# Patient Record
Sex: Female | Born: 1995 | Race: White | Hispanic: No | Marital: Single | Smoking: Former smoker
Health system: Southern US, Community
[De-identification: ages and names within clinical notes are randomized; demographics above are authoritative.]

## PROBLEM LIST (undated history)

## (undated) DIAGNOSIS — Z8041 Family history of malignant neoplasm of ovary: Secondary | ICD-10-CM

## (undated) DIAGNOSIS — F329 Major depressive disorder, single episode, unspecified: Secondary | ICD-10-CM

## (undated) DIAGNOSIS — T50902A Poisoning by unspecified drugs, medicaments and biological substances, intentional self-harm, initial encounter: Secondary | ICD-10-CM

## (undated) DIAGNOSIS — F1310 Sedative, hypnotic or anxiolytic abuse, uncomplicated: Secondary | ICD-10-CM

## (undated) DIAGNOSIS — F419 Anxiety disorder, unspecified: Secondary | ICD-10-CM

## (undated) DIAGNOSIS — F309 Manic episode, unspecified: Secondary | ICD-10-CM

## (undated) DIAGNOSIS — F319 Bipolar disorder, unspecified: Secondary | ICD-10-CM

## (undated) DIAGNOSIS — L709 Acne, unspecified: Secondary | ICD-10-CM

## (undated) DIAGNOSIS — F19922 Other psychoactive substance use, unspecified with intoxication with perceptual disturbance: Secondary | ICD-10-CM

## (undated) DIAGNOSIS — F3181 Bipolar II disorder: Secondary | ICD-10-CM

## (undated) DIAGNOSIS — K649 Unspecified hemorrhoids: Secondary | ICD-10-CM

## (undated) DIAGNOSIS — Z87828 Personal history of other (healed) physical injury and trauma: Secondary | ICD-10-CM

## (undated) DIAGNOSIS — F32A Depression, unspecified: Secondary | ICD-10-CM

## (undated) DIAGNOSIS — K219 Gastro-esophageal reflux disease without esophagitis: Secondary | ICD-10-CM

## (undated) HISTORY — DX: Major depressive disorder, single episode, unspecified: F32.9

## (undated) HISTORY — PX: TONSILLECTOMY: SUR1361

## (undated) HISTORY — DX: Anxiety disorder, unspecified: F41.9

## (undated) HISTORY — DX: Depression, unspecified: F32.A

## (undated) HISTORY — DX: Bipolar disorder, unspecified: F31.9

## (undated) HISTORY — DX: Family history of malignant neoplasm of ovary: Z80.41

---
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
## 2015-10-04 ENCOUNTER — Encounter: Payer: Self-pay | Admitting: Psychiatry

## 2015-10-04 ENCOUNTER — Ambulatory Visit (INDEPENDENT_AMBULATORY_CARE_PROVIDER_SITE_OTHER): Payer: 59 | Admitting: Psychiatry

## 2015-10-04 VITALS — BP 118/78 | HR 99 | Temp 99.2°F | Ht 63.0 in | Wt 121.2 lb

## 2015-10-04 DIAGNOSIS — F3132 Bipolar disorder, current episode depressed, moderate: Secondary | ICD-10-CM

## 2015-10-04 MED ORDER — LURASIDONE HCL 40 MG PO TABS
40.0000 mg | ORAL_TABLET | Freq: Every day | ORAL | Status: DC
Start: 2015-10-04 — End: 2015-11-01
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
## 2015-10-30 ENCOUNTER — Other Ambulatory Visit
Admission: RE | Admit: 2015-10-30 | Discharge: 2015-10-30 | Disposition: A | Discharge status: Home / Self Care | Payer: 59 | Source: Ambulatory Visit | Attending: Psychiatry | Admitting: Psychiatry

## 2015-10-30 DIAGNOSIS — F319 Bipolar disorder, unspecified: Secondary | ICD-10-CM | POA: Diagnosis not present

## 2015-10-30 DIAGNOSIS — Z7984 Long term (current) use of oral hypoglycemic drugs: Secondary | ICD-10-CM | POA: Diagnosis present

## 2015-10-30 LAB — GLUCOSE, RANDOM: Glucose, Bld: 101 mg/dL — ABNORMAL HIGH (ref 65–99)

## 2015-10-30 LAB — LIPID PANEL
CHOLESTEROL: 230 mg/dL — AB (ref 0–200)
HDL: 62 mg/dL (ref >40)
LDL Cholesterol: 153 mg/dL — ABNORMAL HIGH (ref 0–99)
Total CHOL/HDL Ratio: 3.7 RATIO
Triglycerides: 76 mg/dL (ref <150)
VLDL: 15 mg/dL (ref 0–40)

## 2015-10-31 ENCOUNTER — Telehealth: Payer: Self-pay | Admitting: Psychiatry

## 2015-11-01 ENCOUNTER — Encounter: Payer: Self-pay | Admitting: Psychiatry

## 2015-11-01 ENCOUNTER — Ambulatory Visit (INDEPENDENT_AMBULATORY_CARE_PROVIDER_SITE_OTHER): Payer: 59 | Admitting: Psychiatry

## 2015-11-01 VITALS — BP 120/78 | HR 90 | Temp 97.9°F | Ht 63.0 in | Wt 124.4 lb

## 2015-11-01 DIAGNOSIS — F3132 Bipolar disorder, current episode depressed, moderate: Secondary | ICD-10-CM

## 2015-11-01 MED ORDER — CLONAZEPAM 0.5 MG PO TABS: 0.5000 mg | ORAL_TABLET | Freq: Two times a day (BID) | ORAL | Status: DC | PRN

## 2015-11-01 MED ORDER — LURASIDONE HCL 40 MG PO TABS: 40.0000 mg | ORAL_TABLET | Freq: Every day | ORAL | Status: DC

## 2015-11-01 NOTE — Progress Notes (Addendum)
BH MD/PA/NP OP Progress Note  11/01/2015 3:12 PM Holly Farrell  MRN:  [id number]  Subjective:  Patient returns for follow-up of her bipolar disorder most recent episode depressed. It appears there was some confusion in regards to her medications. We have started her Latuda 40 mg a day. Patient reports she liked the medication. She states she felt less depressed and she felt like her mood was more "even." However we provide her with about 2 weeks of samples and I did send a prescription to her pharmacy. However I did note for them to hold the prescription until patient called them that she had 2 weeks' worth of samples. However patient states when she went to the pharmacy they told her she had no prescription there.  Patient states that the only issue she's had is that the JordanLatuda made her a little bit sleepy and so she's been taking at bedtime and she was fine with this. She states that she also notices that when she is anxious or do something stressful going on at work she has some shaking. For example she states if she holds a cigarette she does cigarette will be shaking. She states it does not happen every day and typically only happens during stress. I discussed with her the issue of akathisia post whether this is just simple anxiety which it appears to be from the fact that it's prompted by stressors. Chief Complaint: Some shaking Chief Complaint    Follow-up; Medication Refill     Visit Diagnosis:     ICD-9-CM ICD-10-CM   1. Bipolar affective disorder, currently depressed, moderate (HCC) 296.52 F31.32     Past Medical History:  Past Medical History  Diagnosis Date  . Anxiety   . Depression   . Bipolar disorder [facility](HCC)     Past Surgical History  Procedure Laterality Date  . Tonsillectomy     Family History:  Family History  Problem Relation Age of Onset  . Depression Mother   . Anxiety disorder Mother   . Bipolar disorder Mother   . Hypertension Mother   . Asthma Father   .  Alcohol abuse Father   . Sleep disorder Brother   . Bipolar disorder Maternal Grandfather   . Hypertension Maternal Grandmother    Social History:  Social History   Social History  . Marital Status: Single    Spouse Name: N/A  . Number of Children: N/A  . Years of Education: N/A   Social History Main Topics  . Smoking status: Current Every Day Smoker    Types: Cigarettes    Start date: 10/03/2014  . Smokeless tobacco: Never Used  . Alcohol Use: 0.0 - 7.2 oz/week    0 Glasses of wine, 0 Standard drinks or equivalent, 0-8 Cans of beer, 0-4 Shots of liquor per week  . Drug Use: Yes    Special: Marijuana, Cocaine     Comment: marjuana last used 09-29-15  cocaine last used aug  . Sexual Activity: Yes    Birth Control/ Protection: None, Pill   Other Topics Concern  . None   Social History Narrative   Additional History:   Assessment:   Musculoskeletal: Strength & Muscle Tone: within normal limits Gait & Station: normal Patient leans: N/A  Psychiatric Specialty Exam: HPI  Review of Systems  Psychiatric/Behavioral: Negative for depression, suicidal ideas, hallucinations, memory loss and substance abuse. The patient is nervous/anxious. The patient does not have insomnia.   All other systems reviewed and are negative.  Blood pressure 120/78, pulse 90, temperature 97.9 F (36.6 C), temperature source Tympanic, height  (1.6 m), weight 124 lb 6.4 oz (56.427 kg), last menstrual period 10/09/2015, SpO2 98 %.Body mass index is 22.04 kg/(m^2).  General Appearance: Well Groomed  Eye Contact:  Good  Speech:  Clear and Coherent  Volume:  Normal  Mood:  Good  Affect:  bright, happy  Thought Process:  Linear and Logical  Orientation:  Full (Time, Place, and Person)  Thought Content:  Negative  Suicidal Thoughts:  No  Homicidal Thoughts:  No  Memory:  Immediate;   Good Recent;   Good Remote;   Good  Judgement:  Good  Insight:  Good  Psychomotor Activity:  Negative   Concentration:  Good  Recall:  Good  Fund of Knowledge: Good  Language: Good  Akathisia:  Negative  Handed:    AIMS (if indicated):  Done 11/01/15 and normal  Assets:  Communication Skills Desire for Improvement Social Support Vocational/Educational  ADL's:  Intact  Cognition: WNL  Sleep:  good   Is the patient at risk to self?  No. Has the patient been a risk to self in the past 6 months?  No. Has the patient been a risk to self within the distant past?  Yes.   Is the patient a risk to others?  No. Has the patient been a risk to others in the past 6 months?  No. Has the patient been a risk to others within the distant past?  No.  Current Medications: Current Outpatient Prescriptions  Medication Sig Dispense Refill  . lurasidone (LATUDA) 40 MG TABS tablet Take 1 tablet (40 mg total) by mouth daily with breakfast. 30 tablet 1  . clonazePAM (KLONOPIN) 0.5 MG tablet Take 1 tablet (0.5 mg total) by mouth 2 (two) times daily as needed for anxiety. 60 tablet 1   No current facility-administered medications for this visit.    Medical Decision Making:  Established Problem, Stable/Improving (1), Review of Medication Regimen & Side Effects (2) and Review of New Medication or Change in Dosage (2)  Treatment Plan Summary:Medication management and Plan   Bipolar disorder, most recent episode depressed-patient reported benefit from Jordan. We'll restart her at Latuda 40 mg daily. She felt her mood was more stable and she was happy. She only used a two-week supply of the samples and due to the pharmacy issue noted above she has not been on it. She would like to continue this medication. She does describe some shaking which is episodic and she describes it occurs during stressors. We will start some clonazepam 0.5 mg twice daily as needed to address this issue which is sounds more like anxiety as opposed to an akathisia however we will touch base again after she assesses how she responds to the  clonazepam. Risk and benefits discussed and patient able to consent area   Wallace Going 11/01/2015, 3:12 PM

## 2015-11-15 NOTE — Telephone Encounter (Signed)
will discuss labwork at next appt  12-01-15

## 2015-12-01 ENCOUNTER — Ambulatory Visit: Payer: 59 | Admitting: Psychiatry

## 2015-12-06 ENCOUNTER — Encounter: Payer: Self-pay | Admitting: Psychiatry

## 2015-12-06 ENCOUNTER — Ambulatory Visit (INDEPENDENT_AMBULATORY_CARE_PROVIDER_SITE_OTHER): Payer: 59 | Admitting: Psychiatry

## 2015-12-06 VITALS — BP 122/78 | HR 87 | Temp 97.9°F | Ht 63.0 in | Wt 122.4 lb

## 2015-12-06 DIAGNOSIS — F3132 Bipolar disorder, current episode depressed, moderate: Secondary | ICD-10-CM

## 2015-12-06 MED ORDER — QUETIAPINE FUMARATE ER 150 MG PO TB24: 150.0000 mg | ORAL_TABLET | Freq: Every day | ORAL | Status: DC

## 2015-12-06 NOTE — Progress Notes (Signed)
BH MD/PA/NP OP Progress Note  12/06/2015 2:18 PM Holly Farrell  MRN:  [id number]  Subjective:  Patient returns for follow-up of her bipolar disorder most recent episode depressed. At the last visit the patient reports some benefit from JordanLatuda. However she presents to her appointment today with her mother. Mother states that perhaps Holly KnudsenLatuda was not helpful. They discussed excessive sedation which the patient was sleeping. Patient today states she really feels like the JordanLatuda made her not having any emotions flattened her and is in the past was making her sleepy.  Mother indicates she's been listening to a radio broadcast about bipolar and feels like the discussion of symptoms and issues make her more convinced that her daughter does have bipolar. Mother also states that there was a period of time since last visit where the patient has had irritability. Mother states that the patient is a lot taking the Klonopin and the mother has possession of them because during one of these irritable episodes patient threatened to take all of the Klonopin. Mother states the patient never did that  I discussed that there are other medications as options. However patient brought up that initially when she began treatment she was given some Seroquel. She states she was given Seroquel XL 400 mg this made her very sleepy but she reports that she did like the medication. Chief Complaint: Holly Farrell Latuda Chief Complaint    Follow-up; Medication Refill; Medication Problem; Drug Problem     Visit Diagnosis:     ICD-9-CM ICD-10-CM   1. Bipolar affective disorder, currently depressed, moderate (HCC) 296.52 F31.32     Past Medical History:  Past Medical History  Diagnosis Date  . Anxiety   . Depression   . Bipolar disorder [facility](HCC)     Past Surgical History  Procedure Laterality Date  . Tonsillectomy     Family History:  Family History  Problem Relation Age of Onset  . Depression Mother   . Anxiety disorder Mother    . Bipolar disorder Mother   . Hypertension Mother   . Asthma Father   . Alcohol abuse Father   . Sleep disorder Brother   . Bipolar disorder Maternal Grandfather   . Hypertension Maternal Grandmother    Social History:  Social History   Social History  . Marital Status: Single    Spouse Name: N/A  . Number of Children: N/A  . Years of Education: N/A   Social History Main Topics  . Smoking status: Current Every Day Smoker    Types: Cigarettes    Start date: 10/03/2014  . Smokeless tobacco: Never Used  . Alcohol Use: 0.0 - 7.2 oz/week    0 Glasses of wine, 0-8 Cans of beer, 0-4 Shots of liquor, 0 Standard drinks or equivalent per week  . Drug Use: Yes    Special: Marijuana, Cocaine     Comment: marjuana last used 11-18-15  cocaine last used 11-26 -16  . Sexual Activity: Yes    Birth Control/ Protection: None, Pill   Other Topics Concern  . None   Social History Narrative   Additional History:   Assessment:   Musculoskeletal: Strength & Muscle Tone: within normal limits Gait & Station: normal Patient leans: N/A  Psychiatric Specialty Exam: Drug Problem Pertinent negatives include no hallucinations.    Review of Systems  Psychiatric/Behavioral: Negative for depression, suicidal ideas, hallucinations, memory loss and substance abuse. The patient is not nervous/anxious and does not have insomnia.  Patient doing well today however per mother there were periods where patient has been irritable and had mood fluctuations.  All other systems reviewed and are negative.   Blood pressure 122/78, pulse 87, temperature 97.9 F (36.6 C), temperature source Tympanic, height  (1.6 m), weight 122 lb 6.4 oz (55.52 kg), SpO2 96 %.Body mass index is 21.69 kg/(m^2).  General Appearance: Well Groomed  Eye Contact:  Good  Speech:  Clear and Coherent  Volume:  Normal  Mood:  Good  Affect:  bright, happy  Thought Process:  Linear and Logical  Orientation:  Full (Time,  Place, and Person)  Thought Content:  Negative  Suicidal Thoughts:  No  Homicidal Thoughts:  No  Memory:  Immediate;   Good Recent;   Good Remote;   Good  Judgement:  Good  Insight:  Good  Psychomotor Activity:  Negative  Concentration:  Good  Recall:  Good  Fund of Knowledge: Good  Language: Good  Akathisia:  Negative  Handed:    AIMS (if indicated):  Done 11/01/15 and normal  Assets:  Communication Skills Desire for Improvement Social Support Vocational/Educational  ADL's:  Intact  Cognition: WNL  Sleep:  good   Is the patient at risk to self?  No. Has the patient been a risk to self in the past 6 months?  No. Has the patient been a risk to self within the distant past?  Yes.   Is the patient a risk to others?  No. Has the patient been a risk to others in the past 6 months?  No. Has the patient been a risk to others within the distant past?  No.  Current Medications: Current Outpatient Prescriptions  Medication Sig Dispense Refill  . QUEtiapine Fumarate (SEROQUEL XR) 150 MG 24 hr tablet Take 1 tablet (150 mg total) by mouth at bedtime. 30 tablet 1   No current facility-administered medications for this visit.    Medical Decision Making:  Established Problem, Stable/Improving (1), Review of Medication Regimen & Side Effects (2) and Review of New Medication or Change in Dosage (2)  Treatment Plan Summary:Medication management and Plan   Bipolar disorder, most recent episode depressed-patient is now off Latuda because patient and mother feel it cause sedation and did not stabilize the mood. Given patient has tolerated the Seroquel in the past it may be the 400 mg dose was too much. Thus we will start Seroquel X are 50 mg at bedtime for 3 days and then patient will go to 150 mg at bedtime. We will discontinue the clonazepam as we were using it for possible akathisia related to [place] is no longer being prescribed. Risk and benefits have been discussing patient's  able consent. Patient will follow up in 1 month. I discussed my departure in favor 2016 and indicated that another doctor will be available within the practice to continue their care after my departure.  Samples of Seroquel XR 50 and 150 mg were provided. Wallace Going 12/06/2015, 2:18 PM

## 2015-12-06 NOTE — Patient Instructions (Signed)
Start with Seroquel XR 50 mg (SAMPLES) at bedtime for three nights, then take 150 mg at bedtime.

## 2016-01-04 ENCOUNTER — Ambulatory Visit (INDEPENDENT_AMBULATORY_CARE_PROVIDER_SITE_OTHER): Payer: 59 | Admitting: Psychiatry

## 2016-01-04 ENCOUNTER — Encounter: Payer: Self-pay | Admitting: Psychiatry

## 2016-01-04 VITALS — BP 120/80 | HR 90 | Temp 98.1°F | Ht 63.0 in | Wt 123.2 lb

## 2016-01-04 DIAGNOSIS — F3132 Bipolar disorder, current episode depressed, moderate: Secondary | ICD-10-CM | POA: Diagnosis not present

## 2016-01-04 MED ORDER — QUETIAPINE FUMARATE ER 50 MG PO TB24: 100.0000 mg | ORAL_TABLET | Freq: Every day | ORAL | Status: DC

## 2016-01-04 NOTE — Progress Notes (Signed)
BH MD/PA/NP OP Progress Note  01/04/2016 2:08 PM Holly Farrell  MRN:  [id number]  Subjective:  Patient returns for follow-up of her bipolar disorder most recent episode depressed. At the last visit we discontinued Latuda and started the patient on Seroquel X are. She took 50 mg and then after 3 days increase to 150 mg at bedtime. Patient feels like her mood is even and level however she states that she is feeling somewhat sedated. Her mother comes into the appointment and states that she has been sleeping a lot. I reviewed the sleep hygiene and it appeared that the patient might be having some sedation from her Seroquel. However patient stated she is been forgetting to take her Seroquel at bedtime and then takes it in the morning. I did discuss that the XR form relation does disperse more in the first hours of consumption. Patient indicated that she felt this may be her problem. Mother emphasized that the patient should use her phone to set an alarm to take the medicine.  We have decided were going to decrease her dose from the 150 mg at bedtime to 100 mg at bedtime. Of note the patient indicated to CMA that she uses marijuana almost daily and occasionally has used cocaine. We'll need to address this with patient and her next appointment. Chief Complaint: even Chief Complaint    Follow-up; Medication Refill     Visit Diagnosis:   No diagnosis found.  Past Medical History:  Past Medical History  Diagnosis Date  . Anxiety   . Depression   . Bipolar disorder [facility])     Past Surgical History  Procedure Laterality Date  . Tonsillectomy     Family History:  Family History  Problem Relation Age of Onset  . Depression Mother   . Anxiety disorder Mother   . Bipolar disorder Mother   . Hypertension Mother   . Asthma Father   . Alcohol abuse Father   . Sleep disorder Brother   . Bipolar disorder Maternal Grandfather   . Hypertension Maternal Grandmother    Social History:  Social  History   Social History  . Marital Status: Single    Spouse Name: N/A  . Number of Children: N/A  . Years of Education: N/A   Social History Main Topics  . Smoking status: Current Every Day Smoker    Types: Cigarettes    Start date: 10/03/2014  . Smokeless tobacco: Never Used  . Alcohol Use: 0.0 - 7.2 oz/week    0 Glasses of wine, 0-8 Cans of beer, 0-4 Shots of liquor, 0 Standard drinks or equivalent per week  . Drug Use: Yes    Special: Marijuana, Cocaine     Comment: marjuana last used yesterday cocaine last used 12-24-15  . Sexual Activity: Yes    Birth Control/ Protection: None, Pill   Other Topics Concern  . None   Social History Narrative   Additional History:   Assessment:   Musculoskeletal: Strength & Muscle Tone: within normal limits Gait & Station: normal Patient leans: N/A  Psychiatric Specialty Exam: Drug Problem Pertinent negatives include no hallucinations.    Review of Systems  Psychiatric/Behavioral: Negative for depression, suicidal ideas, hallucinations, memory loss and substance abuse. The patient is not nervous/anxious and does not have insomnia.        Patient doing well today however per mother there were periods where patient has been irritable and had mood fluctuations.  All other systems reviewed and are negative.  Blood pressure 120/80, pulse 90, temperature 98.1 F (36.7 C), temperature source Tympanic, height 5\' 3"  (1.6 m), weight 123 lb 3.2 oz (55.883 kg), last menstrual period 12/11/2015, SpO2 99 %.Body mass index is 21.83 kg/(m^2).  General Appearance: Well Groomed  Eye Contact:  Good  Speech:  Clear and Coherent  Volume:  Normal  Mood:  Good  Affect:  bright, happy  Thought Process:  Linear and Logical  Orientation:  Full (Time, Place, and Person)  Thought Content:  Negative  Suicidal Thoughts:  No  Homicidal Thoughts:  No  Memory:  Immediate;   Good Recent;   Good Remote;   Good  Judgement:  Good  Insight:  Good   Psychomotor Activity:  Negative  Concentration:  Good  Recall:  Good  Fund of Knowledge: Good  Language: Good  Akathisia:  Negative  Handed:    AIMS (if indicated):  Done 11/01/15 and normal  Assets:  Communication Skills Desire for Improvement Social Support Vocational/Educational  ADL's:  Intact  Cognition: WNL  Sleep:  good   Is the patient at risk to self?  No. Has the patient been a risk to self in the past 6 months?  No. Has the patient been a risk to self within the distant past?  Yes.   Is the patient a risk to others?  No. Has the patient been a risk to others in the past 6 months?  No. Has the patient been a risk to others within the distant past?  No.  Current Medications: No current outpatient prescriptions on file.   No current facility-administered medications for this visit.    Medical Decision Making:  Established Problem, Stable/Improving (1), Review of Medication Regimen & Side Effects (2) and Review of New Medication or Change in Dosage (2)  Treatment Plan Summary:Medication management and Plan   Bipolar disorder, most recent episode depressed-Asian is reporting some benefit from the Seroquel XR, feels sedated in the daytime. It might be because patient is taking it in the morning as opposed to bedtime. Nonetheless within a decrease her dose to 100 mg at bedtime and reassess in 1 month.   Samples of Seroquel XR 50, #8 and prescription was sent to her pharmacy. She'll follow up with writer in one month prior to his departure. She is aware she'll be seeing another provider in the clinic after that. Holly Farrell 01/04/2016, 2:08 PM

## 2016-01-30 ENCOUNTER — Ambulatory Visit (INDEPENDENT_AMBULATORY_CARE_PROVIDER_SITE_OTHER): Payer: 59 | Admitting: Psychiatry

## 2016-01-30 ENCOUNTER — Encounter: Payer: Self-pay | Admitting: Psychiatry

## 2016-01-30 VITALS — BP 108/68 | HR 96 | Temp 97.4°F | Ht 63.0 in | Wt 124.8 lb

## 2016-01-30 DIAGNOSIS — F3132 Bipolar disorder, current episode depressed, moderate: Secondary | ICD-10-CM

## 2016-01-30 MED ORDER — HYDROXYZINE PAMOATE 25 MG PO CAPS: ORAL_CAPSULE | ORAL | Status: DC

## 2016-01-30 MED ORDER — QUETIAPINE FUMARATE ER 50 MG PO TB24: 100.0000 mg | ORAL_TABLET | Freq: Every day | ORAL | Status: DC

## 2016-01-30 NOTE — Progress Notes (Signed)
BH MD/PA/NP OP Progress Note  01/30/2016 1:39 PM Holly Farrell  MRN:  [id number]  Subjective:  Patient returns for follow-up of her bipolar disorder most recent episode depressed. At the last visit the patient and mother discussed extensive daytime sedation. However it was revealed the patient was forgetting to take the Seroquel at bedtime was taking it in the morning and thus sleeping during the day. We have now emphasize the patient needs to take it at bedtime in addition we reduced her dose from 150-100 mg. Patient states that this dose she sleeping well and not experiencing any sedation. She feels like the medication is stabilizing her mood.  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 She looks forward to doing these things. She states the stressor right now is that her grandfather has been ill and was in the hospital for what and states may have been sepsis.   I discussed with her that CMA had informing the patient was using marijuana and occasionally cocaine. I clarified this with the patient and she indicates she does use the marijuana about 3 times a week. She states that the cocaine is infrequently and she last used around the holidays. I spent some time discussing that ideally we did not want her to be using these with her medications and she should refrain from such use.  Chief Complaint: Good Chief Complaint    Follow-up; Medication Refill; Anxiety; Drug Problem     Visit Diagnosis:     ICD-9-CM ICD-10-CM   1. Bipolar affective disorder, currently depressed, moderate (HCC) 296.52 F31.32     Past Medical History:  Past Medical History  Diagnosis Date  . Anxiety   . Depression   . Bipolar disorder [facility])     Past Surgical History  Procedure Laterality Date  . Tonsillectomy     Family History:  Family History  Problem Relation Age of Onset  . Depression Mother   . Anxiety  disorder Mother   . Bipolar disorder Mother   . Hypertension Mother   . Asthma Father   . Alcohol abuse Father   . Sleep disorder Brother   . Bipolar disorder Maternal Grandfather   . Hypertension Maternal Grandmother    Social History:  Social History   Social History  . Marital Status: Single    Spouse Name: N/A  . Number of Children: N/A  . Years of Education: N/A   Social History Main Topics  . Smoking status: Current Every Day Smoker    Types: Cigarettes    Start date: 10/03/2014  . Smokeless tobacco: Never Used  . Alcohol Use: 0.0 - 7.2 oz/week    0 Glasses of wine, 0-8 Cans of beer, 0-4 Shots of liquor, 0 Standard drinks or equivalent per week  . Drug Use: Yes    Special: Marijuana, Cocaine     Comment: marjuana last used yesterday cocaine last used 12-24-15  . Sexual Activity: Yes    Birth Control/ Protection: None, Pill   Other Topics Concern  . None   Social History Narrative   Additional History:   Assessment:   Musculoskeletal: Strength & Muscle Tone: within normal limits Gait & Station: normal Patient leans: N/A  Psychiatric Specialty Exam: Anxiety Symptoms include nervous/anxious behavior. Patient reports no insomnia or suicidal ideas.    Drug Problem Pertinent negatives include no hallucinations.    Review of Systems  Psychiatric/Behavioral: Negative  for depression, suicidal ideas, hallucinations, memory loss and substance abuse. The patient is nervous/anxious. The patient does not have insomnia.        Patient doing well today however per mother there were periods where patient has been irritable and had mood fluctuations.  All other systems reviewed and are negative.   Blood pressure 108/68, pulse 96, temperature 97.4 F (36.3 C), temperature source Tympanic, height  (1.6 m), weight 124 lb 12.8 oz (56.609 kg), last menstrual period 01/09/2016, SpO2 98 %.Body mass index is 22.11 kg/(m^2).  General Appearance: Well Groomed  Eye Contact:   Good  Speech:  Clear and Coherent  Volume:  Normal  Mood:  Good  Affect:  bright, happy  Thought Process:  Linear and Logical  Orientation:  Full (Time, Place, and Person)  Thought Content:  Negative  Suicidal Thoughts:  No  Homicidal Thoughts:  No  Memory:  Immediate;   Good Recent;   Good Remote;   Good  Judgement:  Good  Insight:  Good  Psychomotor Activity:  Negative  Concentration:  Good  Recall:  Good  Fund of Knowledge: Good  Language: Good  Akathisia:  Negative  Handed:    AIMS (if indicated):  Done 11/01/15 and normal  Assets:  Communication Skills Desire for Improvement Social Support Vocational/Educational  ADL's:  Intact  Cognition: WNL  Sleep:  good   Is the patient at risk to self?  No. Has the patient been a risk to self in the past 6 months?  No. Has the patient been a risk to self within the distant past?  Yes.   Is the patient a risk to others?  No. Has the patient been a risk to others in the past 6 months?  No. Has the patient been a risk to others within the distant past?  No.  Current Medications: Current Outpatient Prescriptions  Medication Sig Dispense Refill  . QUEtiapine (SEROQUEL XR) 50 MG TB24 24 hr tablet Take 2 tablets (100 mg total) by mouth at bedtime. 60 each 2  . hydrOXYzine (VISTARIL) 25 MG capsule Take one to two capsules twice daily as needed for anxiety. 120 capsule 1   No current facility-administered medications for this visit.    Medical Decision Making:  Established Problem, Stable/Improving (1), Review of Medication Regimen & Side Effects (2) and Review of New Medication or Change in Dosage (2)  Treatment Plan Summary:Medication management and Plan   Bipolar disorder, most recent episode depressed-continue the patient on Seroquel XR 100 mg at bedtime.   Anxiety-patient discusses she has anxiety when she goes out and is in public. I had previously given her some clonazepam and she requested this. I discussed that at this  time I would not be prescribing clonazepam but rather wanted her to try another medication such as BuSpar or Vistaril. We decided we'll try some Vistaril 25-50 mg twice daily as needed for anxiety. Risk and benefits of been discussing patient's able to consent.   She'll follow up with writer in one month. She is aware she'll be seeing another provider in the clinic.  Wallace Going 01/30/2016, 1:39 PM

## 2016-02-27 ENCOUNTER — Encounter: Payer: Self-pay | Admitting: Psychiatry

## 2016-02-27 ENCOUNTER — Ambulatory Visit (INDEPENDENT_AMBULATORY_CARE_PROVIDER_SITE_OTHER): Payer: 59 | Admitting: Psychiatry

## 2016-02-27 DIAGNOSIS — F3110 Bipolar disorder, current episode manic without psychotic features, unspecified: Secondary | ICD-10-CM

## 2016-02-27 NOTE — Progress Notes (Signed)
Patient ID: Holly Farrell, female   DOB: 01-26-1996, 20 y.o.   MRN: [id number] [facility]  02/27/2016 1:37 PM Holly Farrell  MRN:  [id number]  Subjective:  Patient returns for follow-up of her bipolar disorder. Patient was previously seen by Dr. Mayford KnifeWilliams and this is the first visit for this patient with this clinician. She reports that overall she seems to be doing well. Compliant with the Seroquel at bedtime. States that she has not taken the Vistaril and has not needed any medication for anxiety during the daytime. 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 States that her mother is supportive  of her and  patient reports that she feels positive about her future. States that anytime she feels down it's because she is not in school and is somewhat isolated from kids of her age. Reports fair sleep and appetite. Denies any suicidal thoughts.  Chief Complaint: Good Chief Complaint    Follow-up; Medication Refill     Visit Diagnosis:   No diagnosis found.  Past Medical History:  Past Medical History  Diagnosis Date  . Anxiety   . Depression   . Bipolar disorder [facility](HCC)     Past Surgical History  Procedure Laterality Date  . Tonsillectomy     Family History:  Family History  Problem Relation Age of Onset  . Depression Mother   . Anxiety disorder Mother   . Bipolar disorder Mother   . Hypertension Mother   . Asthma Father   . Alcohol abuse Father   . Sleep disorder Brother   . Bipolar disorder Maternal Grandfather   . Hypertension Maternal Grandmother    Social History:  Social History   Social History  . Marital Status: Single    Spouse Name: N/A  . Number of Children: N/A  . Years of Education: N/A   Social History Main Topics  . Smoking status: Current Every Day Smoker    Types: Cigarettes    Start date: 10/03/2014  . Smokeless tobacco: Never Used  . Alcohol Use: 0.0 - 7.2 oz/week     0 Glasses of wine, 0-8 Cans of beer, 0-4 Shots of liquor, 0 Standard drinks or equivalent per week  . Drug Use: Yes    Special: Marijuana, Cocaine     Comment: marjuana last used yesterday cocaine last used 12-24-15  . Sexual Activity: Yes    Birth Control/ Protection: None, Pill   Other Topics Concern  . None   Social History Narrative   Additional History:   Assessment:   Musculoskeletal: Strength & Muscle Tone: within normal limits Gait & Station: normal Patient leans: N/A  Psychiatric Specialty Exam: Anxiety Symptoms include nervous/anxious behavior. Patient reports no insomnia or suicidal ideas.    Drug Problem Pertinent negatives include no hallucinations.    Review of Systems  Psychiatric/Behavioral: Negative for depression, suicidal ideas, hallucinations, memory loss and substance abuse. The patient is nervous/anxious. The patient does not have insomnia.        Patient doing well today however per mother there were periods where patient has been irritable and had mood fluctuations.  All other systems reviewed and are negative.   Blood pressure 110/78, pulse 83, temperature 97.4 F (36.3 C), temperature source Tympanic, height 5\' 3"  (1.6 m), weight 119 lb (53.978 kg), last menstrual period 02/06/2016, SpO2 98 %.Body mass index is 21.09 kg/(m^2).  General Appearance: Well Groomed  Eye Contact:  Good  Speech:  Clear and Coherent  Volume:  Normal  Mood:  Good  Affect:  bright, happy  Thought Process:  Linear and Logical  Orientation:  Full (Time, Place, and Person)  Thought Content:  Negative  Suicidal Thoughts:  No  Homicidal Thoughts:  No  Memory:  Immediate;   Good Recent;   Good Remote;   Good  Judgement:  Good  Insight:  Good  Psychomotor Activity:  Negative  Concentration:  Good  Recall:  Good  Fund of Knowledge: Good  Language: Good  Akathisia:  Negative  Handed:    AIMS (if indicated):  Done 11/01/15 and normal  Assets:  Communication  Skills Desire for Improvement Social Support Vocational/Educational  ADL's:  Intact  Cognition: WNL  Sleep:  good   Is the patient at risk to self?  No. Has the patient been a risk to self in the past 6 months?  No. Has the patient been a risk to self within the distant past?  Yes.   Is the patient a risk to others?  No. Has the patient been a risk to others in the past 6 months?  No. Has the patient been a risk to others within the distant past?  No.  Current Medications: Current Outpatient Prescriptions  Medication Sig Dispense Refill  . hydrOXYzine (VISTARIL) 25 MG capsule Take one to two capsules twice daily as needed for anxiety. 120 capsule 1  . QUEtiapine (SEROQUEL XR) 50 MG TB24 24 hr tablet Take 2 tablets (100 mg total) by mouth at bedtime. 60 each 2   No current facility-administered medications for this visit.    Medical Decision Making:  Established Problem, Stable/Improving (1), Review of Medication Regimen & Side Effects (2) and Review of New Medication or Change in Dosage (2)  Treatment Plan Summary:Medication management and Plan   Bipolar disorder, most recent episode depressed-continue the patient on Seroquel XR 100 mg at bedtime.  Discussed strategies to cope with different stressors and to have a positive outlook in life.  Anxiety- continue Vistaril 25-50 mg twice daily as needed for anxiety. Risk and benefits of been discussed.   She'll follow up in 2 months.  Wyman Meschke 02/27/2016, 1:37 PM

## 2016-04-23 ENCOUNTER — Ambulatory Visit (INDEPENDENT_AMBULATORY_CARE_PROVIDER_SITE_OTHER): Payer: 59 | Admitting: Psychiatry

## 2016-04-23 DIAGNOSIS — F3132 Bipolar disorder, current episode depressed, moderate: Secondary | ICD-10-CM | POA: Diagnosis not present

## 2016-04-23 MED ORDER — SERTRALINE HCL 25 MG PO TABS: 25.0000 mg | ORAL_TABLET | Freq: Every day | ORAL | Status: DC

## 2016-04-23 MED ORDER — QUETIAPINE FUMARATE ER 50 MG PO TB24: 50.0000 mg | ORAL_TABLET | Freq: Every day | ORAL | Status: DC
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
## 2016-05-07 ENCOUNTER — Ambulatory Visit (INDEPENDENT_AMBULATORY_CARE_PROVIDER_SITE_OTHER): Payer: 59 | Admitting: Psychiatry

## 2016-06-24 ENCOUNTER — Encounter: Payer: Self-pay | Admitting: *Deleted

## 2016-06-24 ENCOUNTER — Emergency Department
Admission: EM | Admit: 2016-06-24 | Discharge: 2016-06-25 | Disposition: A | Discharge status: Other Acute Inpatient Hospital | Payer: 59 | Attending: Emergency Medicine | Admitting: Emergency Medicine

## 2016-06-24 DIAGNOSIS — F1290 Cannabis use, unspecified, uncomplicated: Secondary | ICD-10-CM | POA: Insufficient documentation

## 2016-06-24 DIAGNOSIS — F3181 Bipolar II disorder: Secondary | ICD-10-CM | POA: Diagnosis not present

## 2016-06-24 DIAGNOSIS — T424X2S Poisoning by benzodiazepines, intentional self-harm, sequela: Secondary | ICD-10-CM | POA: Diagnosis not present

## 2016-06-24 DIAGNOSIS — R45851 Suicidal ideations: Secondary | ICD-10-CM | POA: Diagnosis present

## 2016-06-24 DIAGNOSIS — F17210 Nicotine dependence, cigarettes, uncomplicated: Secondary | ICD-10-CM | POA: Diagnosis not present

## 2016-06-24 DIAGNOSIS — T50902A Poisoning by unspecified drugs, medicaments and biological substances, intentional self-harm, initial encounter: Secondary | ICD-10-CM | POA: Diagnosis present

## 2016-06-24 DIAGNOSIS — F17200 Nicotine dependence, unspecified, uncomplicated: Secondary | ICD-10-CM | POA: Diagnosis present

## 2016-06-24 DIAGNOSIS — F1490 Cocaine use, unspecified, uncomplicated: Secondary | ICD-10-CM | POA: Insufficient documentation

## 2016-06-24 DIAGNOSIS — T5492XS Toxic effect of unspecified corrosive substance, intentional self-harm, sequela: Secondary | ICD-10-CM

## 2016-06-24 LAB — CBC WITH DIFFERENTIAL/PLATELET
BASOS PCT: 1 %
Basophils Absolute: 0.1 10*3/uL (ref 0–0.1)
EOS ABS: 0.3 10*3/uL (ref 0–0.7)
EOS PCT: 2 %
HCT: 41.8 % (ref 35.0–47.0)
HEMOGLOBIN: 14.4 g/dL (ref 12.0–16.0)
LYMPHS ABS: 3.4 10*3/uL (ref 1.0–3.6)
Lymphocytes Relative: 29 %
MCH: 29 pg (ref 26.0–34.0)
MCHC: 34.4 g/dL (ref 32.0–36.0)
MCV: 84.3 fL (ref 80.0–100.0)
Monocytes Absolute: 0.6 10*3/uL (ref 0.2–0.9)
Monocytes Relative: 5 %
NEUTROS PCT: 63 %
Neutro Abs: 7.3 10*3/uL — ABNORMAL HIGH (ref 1.4–6.5)
PLATELETS: 297 10*3/uL (ref 150–440)
RBC: 4.96 MIL/uL (ref 3.80–5.20)
RDW: 14.3 % (ref 11.5–14.5)
WBC: 11.7 10*3/uL — AB (ref 3.6–11.0)

## 2016-06-24 LAB — COMPREHENSIVE METABOLIC PANEL
ALBUMIN: 4.8 g/dL (ref 3.5–5.0)
ALK PHOS: 74 U/L (ref 38–126)
ALT: 14 U/L (ref 14–54)
ANION GAP: 6 (ref 5–15)
AST: 22 U/L (ref 15–41)
BUN: 14 mg/dL (ref 6–20)
CALCIUM: 9.6 mg/dL (ref 8.9–10.3)
CHLORIDE: 103 mmol/L (ref 101–111)
CO2: 28 mmol/L (ref 22–32)
Creatinine, Ser: 0.79 mg/dL (ref 0.44–1.00)
GFR calc non Af Amer: >60 mL/min (ref >60)
GLUCOSE: 79 mg/dL (ref 65–99)
Potassium: 3.5 mmol/L (ref 3.5–5.1)
SODIUM: 137 mmol/L (ref 135–145)
Total Bilirubin: 0.7 mg/dL (ref 0.3–1.2)
Total Protein: 7.6 g/dL (ref 6.5–8.1)

## 2016-06-24 LAB — SALICYLATE LEVEL

## 2016-06-24 LAB — ACETAMINOPHEN LEVEL

## 2016-06-24 LAB — ETHANOL: Alcohol, Ethyl (B): <5 mg/dL (ref <5)
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
## 2016-06-24 NOTE — ED Notes (Signed)
Emergency MD at bedside

## 2016-06-25 ENCOUNTER — Inpatient Hospital Stay
Admission: RE | Admit: 2016-06-25 | Discharge: 2016-06-26 | DRG: 885 | Disposition: A | Discharge status: Home / Self Care | Payer: 59 | Source: Intra-hospital | Attending: Psychiatry | Admitting: Psychiatry

## 2016-06-25 ENCOUNTER — Encounter: Payer: Self-pay | Admitting: *Deleted

## 2016-06-25 ENCOUNTER — Encounter: Payer: Self-pay | Admitting: Psychiatry

## 2016-06-25 DIAGNOSIS — Z818 Family history of other mental and behavioral disorders: Secondary | ICD-10-CM

## 2016-06-25 DIAGNOSIS — T50902A Poisoning by unspecified drugs, medicaments and biological substances, intentional self-harm, initial encounter: Secondary | ICD-10-CM | POA: Diagnosis present

## 2016-06-25 DIAGNOSIS — F17210 Nicotine dependence, cigarettes, uncomplicated: Secondary | ICD-10-CM | POA: Diagnosis present

## 2016-06-25 DIAGNOSIS — Z811 Family history of alcohol abuse and dependence: Secondary | ICD-10-CM

## 2016-06-25 DIAGNOSIS — T543X2A Toxic effect of corrosive alkalis and alkali-like substances, intentional self-harm, initial encounter: Secondary | ICD-10-CM | POA: Diagnosis present

## 2016-06-25 DIAGNOSIS — Z888 Allergy status to other drugs, medicaments and biological substances status: Secondary | ICD-10-CM | POA: Diagnosis not present

## 2016-06-25 DIAGNOSIS — F17200 Nicotine dependence, unspecified, uncomplicated: Secondary | ICD-10-CM | POA: Diagnosis present

## 2016-06-25 DIAGNOSIS — F3181 Bipolar II disorder: Principal | ICD-10-CM | POA: Diagnosis present

## 2016-06-25 DIAGNOSIS — Z825 Family history of asthma and other chronic lower respiratory diseases: Secondary | ICD-10-CM

## 2016-06-25 DIAGNOSIS — G4700 Insomnia, unspecified: Secondary | ICD-10-CM | POA: Diagnosis present

## 2016-06-25 DIAGNOSIS — T424X2A Poisoning by benzodiazepines, intentional self-harm, initial encounter: Secondary | ICD-10-CM | POA: Diagnosis present

## 2016-06-25 DIAGNOSIS — R45851 Suicidal ideations: Secondary | ICD-10-CM | POA: Diagnosis present

## 2016-06-25 DIAGNOSIS — F1310 Sedative, hypnotic or anxiolytic abuse, uncomplicated: Secondary | ICD-10-CM | POA: Diagnosis present

## 2016-06-25 DIAGNOSIS — Z886 Allergy status to analgesic agent status: Secondary | ICD-10-CM

## 2016-06-25 DIAGNOSIS — Z8249 Family history of ischemic heart disease and other diseases of the circulatory system: Secondary | ICD-10-CM

## 2016-06-25 DIAGNOSIS — Z9889 Other specified postprocedural states: Secondary | ICD-10-CM | POA: Diagnosis not present

## 2016-06-25 DIAGNOSIS — Z79899 Other long term (current) drug therapy: Secondary | ICD-10-CM | POA: Diagnosis not present

## 2016-06-25 DIAGNOSIS — F1220 Cannabis dependence, uncomplicated: Secondary | ICD-10-CM | POA: Diagnosis present

## 2016-06-25 HISTORY — DX: Poisoning by unspecified drugs, medicaments and biological substances, intentional self-harm, initial encounter: T50.902A

## 2016-06-25 HISTORY — DX: Bipolar II disorder: F31.81

## 2016-06-25 LAB — URINE DRUG SCREEN, QUALITATIVE (ARMC ONLY)
Amphetamines, Ur Screen: NOT DETECTED
BARBITURATES, UR SCREEN: NOT DETECTED
BENZODIAZEPINE, UR SCRN: POSITIVE — AB
CANNABINOID 50 NG, UR ~~LOC~~: POSITIVE — AB
COCAINE METABOLITE, UR ~~LOC~~: NOT DETECTED
MDMA (Ecstasy)Ur Screen: NOT DETECTED
METHADONE SCREEN, URINE: NOT DETECTED
OPIATE, UR SCREEN: NOT DETECTED
PHENCYCLIDINE (PCP) UR S: NOT DETECTED
Tricyclic, Ur Screen: NOT DETECTED

## 2016-06-25 LAB — URINALYSIS COMPLETE WITH MICROSCOPIC (ARMC ONLY)
BILIRUBIN URINE: NEGATIVE
Bacteria, UA: NONE SEEN
GLUCOSE, UA: NEGATIVE mg/dL
HGB URINE DIPSTICK: NEGATIVE
Ketones, ur: NEGATIVE mg/dL
LEUKOCYTES UA: NEGATIVE
NITRITE: NEGATIVE
PH: 5 (ref 5.0–8.0)
Protein, ur: NEGATIVE mg/dL
RBC / HPF: NONE SEEN RBC/hpf (ref 0–5)
SPECIFIC GRAVITY, URINE: 1.016 (ref 1.005–1.030)
WBC, UA: NONE SEEN WBC/hpf (ref 0–5)

## 2016-06-25 MED ORDER — TRAZODONE HCL 100 MG PO TABS: 100.0000 mg | ORAL_TABLET | Freq: Every evening | ORAL | Status: DC | PRN

## 2016-06-25 MED ORDER — MAGNESIUM HYDROXIDE 400 MG/5ML PO SUSP: 30.0000 mL | Freq: Every day | ORAL | Status: DC | PRN

## 2016-06-25 MED ORDER — ALUM & MAG HYDROXIDE-SIMETH 200-200-20 MG/5ML PO SUSP: 30.0000 mL | ORAL | Status: DC | PRN

## 2016-06-25 MED ORDER — HYDROXYZINE HCL 25 MG PO TABS
25.0000 mg | ORAL_TABLET | Freq: Four times a day (QID) | ORAL | Status: DC | PRN
  Administered 2016-06-25: 25 mg via ORAL
  Filled 2016-06-25: qty 1

## 2016-06-25 MED ORDER — NICOTINE 21 MG/24HR TD PT24
21.0000 mg | MEDICATED_PATCH | Freq: Every day | TRANSDERMAL | Status: DC
  Administered 2016-06-25: 21 mg via TRANSDERMAL

## 2016-06-25 MED ORDER — SERTRALINE HCL 50 MG PO TABS
50.0000 mg | ORAL_TABLET | Freq: Every day | ORAL | Status: DC
  Administered 2016-06-25 – 2016-06-26 (×2): 50 mg via ORAL
  Filled 2016-06-25 (×2): qty 1

## 2016-06-25 MED ORDER — ACETAMINOPHEN 325 MG PO TABS: 650.0000 mg | ORAL_TABLET | Freq: Four times a day (QID) | ORAL | Status: DC | PRN

## 2016-06-25 MED ORDER — SERTRALINE HCL 50 MG PO TABS: 50.0000 mg | ORAL_TABLET | Freq: Every day | ORAL | Status: DC

## 2016-06-25 MED ORDER — QUETIAPINE FUMARATE 100 MG PO TABS
100.0000 mg | ORAL_TABLET | Freq: Every day | ORAL | Status: DC
  Administered 2016-06-25: 100 mg via ORAL
  Filled 2016-06-25: qty 1

## 2016-06-25 NOTE — ED Notes (Signed)
Patient verbally provided consent for this RN to speak with her mother when password previously obtained. Patient ok with RN sharing nature of visit, assessment, treatment, and disposition information with her mother; "You can tell her everything". RN updated on POC as it stands at this time. Mother made aware that patient will be seen by psychiatrist in the morning and he will make the decision as to whether or not she needs to be admitted, or if she can be treated on an outpatient basis. Mother asking that nothing be prescribed citing the fact that [place] [facility] [place] [facility] States, "She really likes Dr. Daleen Boavi and she has her medications adjusted well. I dont want to mess up her progress". Mother asking that MD include her (mother) in the plan of care for this patient; note to be sent to [facility] alerting him to this mother's request. ED BEH visiting hours and phone call times reviewed with patient's mother; sheet provided for reference. Mother advised that patient was "ok" and that she was stable and resting comfortably at this time. RN encouraged patient's mother to go home and get some rest and follow up tomorrow. Mother appreciative of provided information; leaving at this time; her number is on file should we need to contact her.

## 2016-06-25 NOTE — ED Notes (Signed)
Informed MD of Poison Control recommendation and gave patient 10 oz. cup of ice water.  She drank all of it with no problems and told us she would let us know if her stomach started to bother her.

## 2016-06-25 NOTE — ED Notes (Signed)
[facility] the family is still out in waiting room and wanting a status update.  I told FN they can tell family about the PC PO challenge results and there is a psyche consult order in.  There is no other update.

## 2016-06-25 NOTE — ED Notes (Signed)
CJ from poison control called to check on patient status, I gave him patient VS and blood results.  He suggested we try a PO challenge with water and notify MD prior to performing.  He will check back later for results of PO challenge.

## 2016-06-25 NOTE — ED Notes (Signed)
Updated family on status of patient, they were very concerned and were very adamant that this patient needs inpatient mental health care.

## 2016-06-25 NOTE — ED Notes (Signed)
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
## 2016-06-25 NOTE — ED Notes (Signed)
[facility], [facility] will [facility] when we get urine.

## 2016-06-25 NOTE — ED Notes (Signed)
Dr Demetrius CharityP. Is talking with patient.

## 2016-06-25 NOTE — ED Notes (Signed)
Patient's boyfriend in to visit. Patient is calm and cooperative.

## 2016-06-25 NOTE — ED Notes (Signed)
D: Pt denies SI/HI/AVH. Pt stated she did not know why she drank Drano. Per report pt had not GI issues before coming over.   A: Pt introduced to unit Q 15 minute checks were done for safety.   R: Pt has no complaints.Pt receptive to treatment and safety maintained on unit.

## 2016-06-25 NOTE — ED Notes (Addendum)
Writer requested urine of pt from ED, but did not refuse to accept pt when  Onalee Huaavid RN  initially called report .

## 2016-06-25 NOTE — ED Notes (Signed)
CJ from poison control called to check on patient status post-PO challenge.  I told him the patient tolerated 10 oz. of water with no difficulty or nausea. I also told him we have transferred the patient to our locked psyche ED unit and she is no longer under my care.  He is closing the case, I am updating the RN caring for her.

## 2016-06-25 NOTE — ED Notes (Signed)
Patient with discharge orders, No signs of distress, all belongings given to Patient. [facility] custody and nurse. Patient is going to room 303, called report to Sempra EnergyJanet RN.

## 2016-06-25 NOTE — Tx Team (Signed)
Initial Interdisciplinary Treatment Plan   PATIENT STRESSORS: Loss of relationship Substance abuse   PATIENT STRENGTHS: Average or above average intelligence Capable of independent living Communication skills Supportive family/friends   PROBLEM LIST: Problem List/Patient Goals Date to be addressed Date deferred Reason deferred Estimated date of resolution  Bipolar      Suicide ideation/attempt                                                 DISCHARGE CRITERIA:  Improved stabilization in mood, thinking, and/or behavior  PRELIMINARY DISCHARGE PLAN: Outpatient therapy  PATIENT/FAMIILY INVOLVEMENT: This treatment plan has been presented to and reviewed with the patient, Jennell CornerBriana E Mogle, and/or family member. The patient and family have been given the opportunity to ask questions and make suggestions.  Shelia MediaJones, Gwendolyne Welford 06/25/2016, 5:18 PM
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
## 2016-06-25 NOTE — ED Notes (Signed)
Patient's mom in to visit, patient is calm and cooperative.

## 2016-06-25 NOTE — ED Notes (Signed)
Charge nurse called [facility] and they will now accept patient without first obtaining urine.  She is to be sent to room number 4.

## 2016-06-25 NOTE — Consult Note (Addendum)
  PLAN: 1. [name] meets criteria for psychiatric admission.  2. I will put admission orders in.  3. See H&P for details.

## 2016-06-25 NOTE — ED Provider Notes (Signed)
-----------------------------------------   7:19 AM on 06/25/2016 -----------------------------------------   Blood pressure 134/92, pulse 90, temperature 98.3 F (36.8 C), temperature source Oral, resp. rate 14, height 5\' 6"  (1.676 m), weight 119 lb (53.978 kg), last menstrual period 06/12/2016, SpO2 100 %.  The patient had no acute events since last update.  Calm and cooperative at this time.  Disposition is pending per Psychiatry/Behavioral Medicine team recommendations.     Irean HongJade J Sung, MD 06/25/16 [phone]

## 2016-06-25 NOTE — Consult Note (Signed)
[place] [facility] Consult   Reason for Consult:  Suicide attempt. Referring Physician:  Dr. Dineen Kid Patient Identification: Holly Farrell MRN:  [id number] Principal Diagnosis: Bipolar II disorder [facility]) Diagnosis:   Patient Active Problem List   Diagnosis Date Noted  . Bipolar II disorder ([place]) [F31.81] 06/25/2016  . Suicide attempt by drug ingestion ([place]) [T50.902A] 06/25/2016  . Tobacco use disorder [F17.200] 06/25/2016    Total Time spent with patient: 1 hour  Subjective:    Identifying data. Holly Farrell is a 20 y.o. female with a history of depression, anxiety and mood instability.  Chief complaint. "I was trying to get his attention."  History of present illness. Information was obtained from the patient and the chart. The patient has a history of depression and anxiety and has been inpatient at [facility] psychiatric associates. Her prior psychiatrist use to prescribe Seroquel for mood stabilization and Vistaril for anxiety. She now sees Dr. Einar Grad will just started her on the Zoloft. The patient reports that when on medications she does not experience any symptoms of depression or anxiety. She admits that she has been taking Xanax that is not prescribed. On the day of admission she was arguing with her boyfriend of 6 months and first took an overdose of Xanax. Reportedly she took 8 tablets of 2 mg Xanax. Then she tried to drink Drano. She initially admitted that it was a suicide attempt but to me she is claiming that she was just trying to get her boyfriend's attention. This is not a good relationship and for the entire 6 months they've been arguing. She decided to leave him and move in with her mother. She denies psychotic symptoms or symptoms suggestive of bipolar mania. She does report mood instability. She admitted to abusing Xanax. She denies alcohol or illicit substance use but her urine tox screen is not back yet.  Past psychiatric history. She was hospitalized at  [facility] for a nervous breakdown with psychosis. She reports severe stressors at the time. She was treated with Seroquel 400 mg but found it too sedating. She was also treated with Zoloft and believe that it works well in the past. She was tried on Lamictal but developed an allergic reaction. She was continued on low-dose Seroquel by her current psychiatrist. Recently Zoloft was added to her regimen. The patient was diagnosed with bipolar 2 disorder previously at [facility]. Dr. Einar Grad, her current psychiatrist, believes that she has anxiety and depression. She denies ever attempting suicide.  Family psychiatric history. Her grandfather committed suicide, mother has bipolar, multiple family members with anxiety.  Social history. She graduated from high school and went to college for a year. She will return to college in September. She lives with her boyfriend of 6 months but wants to end this relationship. She will be moving back with her mother. She does have health insurance  Risk to Self: Suicidal Ideation: Yes-Currently Present Suicidal Intent: Yes-Currently Present Is patient at risk for suicide?: Yes Suicidal Plan?: Yes-Currently Present Specify Current Suicidal Plan: Overdose Access to Means: No (Currently in the hospital) What has been your use of drugs/alcohol within the last 12 months?: Use of cocaine, marijuana, xanax, and alcohol How many times?: 0 Other Self Harm Risks: reckeless behaviors Triggers for Past Attempts: Unknown Intentional Self Injurious Behavior: None Risk to Others: Homicidal Ideation: No Thoughts of Harm to Others: No Current Homicidal Intent: No Current Homicidal Plan: No Access to Homicidal Means: No Identified Victim: none identified History of harm to others?:  No Assessment of Violence: None Noted Violent Behavior Description: denied Does patient have access to weapons?: No Criminal Charges Pending?: Yes Describe Pending Criminal Charges: Misdomeanor Larceny Does  patient have a court date: Yes Court Date: 07/17/16 Prior Inpatient Therapy: Prior Inpatient Therapy: Yes Prior Therapy Dates: 2015 Prior Therapy Facilty/Provider(s): [facility] Reason for Treatment: Bipolar  Prior Outpatient Therapy: Prior Outpatient Therapy: Yes Prior Therapy Dates: Current Prior Therapy Facilty/Provider(s): [facility] Reason for Treatment: Bipolar Does patient have an ACCT team?: No Does patient have Intensive In-House Services?  : No Does patient have [facility] services? : No Does patient have P4CC services?: No  Past Medical History:  Past Medical History  Diagnosis Date  . Anxiety   . Depression   . Bipolar disorder [facility])     Past Surgical History  Procedure Laterality Date  . Tonsillectomy     Family History:  Family History  Problem Relation Age of Onset  . Depression Mother   . Anxiety disorder Mother   . Bipolar disorder Mother   . Hypertension Mother   . Asthma Father   . Alcohol abuse Father   . Sleep disorder Brother   . Bipolar disorder Maternal Grandfather   . Hypertension Maternal Grandmother     Social History:  History  Alcohol Use  . 0.0 - 7.2 oz/week  . 0 Glasses of wine, 0-8 Cans of beer, 0-4 Shots of liquor, 0 Standard drinks or equivalent per week     History  Drug Use  . Yes  . Special: Marijuana, Cocaine    Comment: marjuana last used yesterday cocaine last used 12-24-15 also uses Xanax    Social History   Social History  . Marital Status: Single    Spouse Name: N/A  . Number of Children: N/A  . Years of Education: N/A   Social History Main Topics  . Smoking status: Current Every Day Smoker -- 0.50 packs/day    Types: Cigarettes    Start date: 10/03/2014  . Smokeless tobacco: Never Used  . Alcohol Use: 0.0 - 7.2 oz/week    0 Glasses of wine, 0-8 Cans of beer, 0-4 Shots of liquor, 0 Standard drinks or equivalent per week  . Drug Use: Yes    Special: Marijuana, Cocaine     Comment: marjuana last used  yesterday cocaine last used 12-24-15 also uses Xanax  . Sexual Activity: Yes    Birth Control/ Protection: None, Pill   Other Topics Concern  . None   Social History Narrative   Additional Social History:    Allergies:   Allergies  Allergen Reactions  . Codeine     Patient stats she might have had an allergic reaction at 20 y/o, but then states she took it when older with a mild issue (nausea and vomiting)  . Lamictal [Lamotrigine] Rash    Labs:  Results for orders placed or performed during the hospital encounter of 06/24/16 (from the past 48 hour(s))  CBC with Differential     Status: Abnormal   Collection Time: 06/24/16  9:57 PM  Result Value Ref Range   WBC 11.7 (H) 3.6 - 11.0 K/uL   RBC 4.96 3.80 - 5.20 MIL/uL   Hemoglobin 14.4 12.0 - 16.0 g/dL   HCT 41.8 35.0 - 47.0 %   MCV 84.3 80.0 - 100.0 fL   MCH 29.0 26.0 - 34.0 pg   MCHC 34.4 32.0 - 36.0 g/dL   RDW 14.3 11.5 - 14.5 %   Platelets  297 150 - 440 K/uL   Neutrophils Relative % 63 %   Neutro Abs 7.3 (H) 1.4 - 6.5 K/uL   Lymphocytes Relative 29 %   Lymphs Abs 3.4 1.0 - 3.6 K/uL   Monocytes Relative 5 %   Monocytes Absolute 0.6 0.2 - 0.9 K/uL   Eosinophils Relative 2 %   Eosinophils Absolute 0.3 0 - 0.7 K/uL   Basophils Relative 1 %   Basophils Absolute 0.1 0 - 0.1 K/uL  Comprehensive metabolic panel     Status: None   Collection Time: 06/24/16  9:57 PM  Result Value Ref Range   Sodium 137 135 - 145 mmol/L   Potassium 3.5 3.5 - 5.1 mmol/L   Chloride 103 101 - 111 mmol/L   CO2 28 22 - 32 mmol/L   Glucose, Bld 79 65 - 99 mg/dL   BUN 14 6 - 20 mg/dL   Creatinine, Ser 0.79 0.44 - 1.00 mg/dL   Calcium 9.6 8.9 - 10.3 mg/dL   Total Protein 7.6 6.5 - 8.1 g/dL   Albumin 4.8 3.5 - 5.0 g/dL   AST 22 15 - 41 U/L   ALT 14 14 - 54 U/L   Alkaline Phosphatase 74 38 - 126 U/L   Total Bilirubin 0.7 0.3 - 1.2 mg/dL   GFR calc non Af Amer >60 >60 mL/min   GFR calc Af Amer >60 >60 mL/min    Comment: (NOTE) The eGFR has  been calculated using the CKD EPI equation. This calculation has not been validated in all clinical situations. eGFR's persistently <60 mL/min signify possible Chronic Kidney Disease.    Anion gap 6 5 - 15  Ethanol     Status: None   Collection Time: 06/24/16  9:57 PM  Result Value Ref Range   Alcohol, Ethyl (B) <5 <5 mg/dL    Comment:        LOWEST DETECTABLE LIMIT FOR SERUM ALCOHOL IS 5 mg/dL FOR MEDICAL PURPOSES ONLY   Salicylate level     Status: None   Collection Time: 06/24/16  9:57 PM  Result Value Ref Range   Salicylate Lvl <1.6 2.8 - 30.0 mg/dL  Acetaminophen level     Status: Abnormal   Collection Time: 06/24/16  9:57 PM  Result Value Ref Range   Acetaminophen (Tylenol), Serum <10 (L) 10 - 30 ug/mL    Comment:        THERAPEUTIC CONCENTRATIONS VARY SIGNIFICANTLY. A RANGE OF 10-30 ug/mL MAY BE AN EFFECTIVE CONCENTRATION FOR MANY PATIENTS. HOWEVER, SOME ARE BEST TREATED AT CONCENTRATIONS OUTSIDE THIS RANGE. ACETAMINOPHEN CONCENTRATIONS >150 ug/mL AT 4 HOURS AFTER INGESTION AND >50 ug/mL AT 12 HOURS AFTER INGESTION ARE OFTEN ASSOCIATED WITH TOXIC REACTIONS.     No current facility-administered medications for this encounter.   Current Outpatient Prescriptions  Medication Sig Dispense Refill  . hydrOXYzine (VISTARIL) 25 MG capsule Take one to two capsules twice daily as needed for anxiety. 120 capsule 1  . QUEtiapine (SEROQUEL XR) 50 MG TB24 24 hr tablet Take 1 tablet (50 mg total) by mouth at bedtime. 30 each 1  . sertraline (ZOLOFT) 25 MG tablet Take 1 tablet (25 mg total) by mouth daily. 30 tablet 2    Musculoskeletal: Strength & Muscle Tone: within normal limits Gait & Station: normal Patient leans: N/A  Psychiatric Specialty Exam: Physical Exam  Nursing note and vitals reviewed.   Review of Systems  Psychiatric/Behavioral: Positive for depression and suicidal ideas. The patient is nervous/anxious.   All other  systems reviewed and are negative.    Blood pressure 134/92, pulse 90, temperature 98.3 F (36.8 C), temperature source Oral, resp. rate 14, height _0  (1.676 m), weight 53.978 kg (119 lb), last menstrual period 06/12/2016, SpO2 100 %.Body mass index is 19.22 kg/(m^2).  General Appearance: Casual  Eye Contact:  Good  Speech:  Clear and Coherent  Volume:  Normal  Mood:  Anxious  Affect:  Appropriate  Thought Process:  Goal Directed  Orientation:  Full (Time, Place, and Person)  Thought Content:  WDL  Suicidal Thoughts:  Yes.  with intent/plan  Homicidal Thoughts:  No  Memory:  Immediate;   Fair Recent;   Fair Remote;   Fair  Judgement:  Impaired  Insight:  Shallow  Psychomotor Activity:  Decreased  Concentration:  Concentration: Fair and Attention Span: Fair  Recall:  [employer] of Knowledge:  Fair  Language:  Fair  Akathisia:  No  Handed:  Right  AIMS (if indicated):     Assets:  Communication Skills Desire for Improvement Financial Resources/Insurance Housing Physical Health Resilience Social Support  ADL's:  Intact  Cognition:  WNL  Sleep:        Treatment Plan Summary: The patient meets criteria for psychistric admission.  Admission orders in place.  Disposition: Recommend psychiatric Inpatient admission when medically cleared. Supportive therapy provided about ongoing stressors. Discussed crisis plan, support from social network, calling 911, coming to the Emergency Department, and calling Suicide Hotline.  Orson Slick, MD 06/25/2016 2:20 PM

## 2016-06-26 DIAGNOSIS — F3181 Bipolar II disorder: Principal | ICD-10-CM

## 2016-06-26 DIAGNOSIS — F1310 Sedative, hypnotic or anxiolytic abuse, uncomplicated: Secondary | ICD-10-CM | POA: Diagnosis present

## 2016-06-26 DIAGNOSIS — F1220 Cannabis dependence, uncomplicated: Secondary | ICD-10-CM | POA: Diagnosis present

## 2016-06-26 HISTORY — DX: Sedative, hypnotic or anxiolytic abuse, uncomplicated: F13.10

## 2016-06-26 LAB — LIPID PANEL
CHOL/HDL RATIO: 3.4 ratio
CHOLESTEROL: 220 mg/dL — AB (ref 0–200)
HDL: 65 mg/dL (ref >40)
LDL Cholesterol: 135 mg/dL — ABNORMAL HIGH (ref 0–99)
TRIGLYCERIDES: 102 mg/dL (ref <150)
VLDL: 20 mg/dL (ref 0–40)

## 2016-06-26 LAB — TSH: TSH: 3.386 u[IU]/mL (ref 0.350–4.500)

## 2016-06-26 LAB — HEMOGLOBIN A1C: HEMOGLOBIN A1C: 5.1 % (ref 4.0–6.0)

## 2016-06-26 LAB — HCG, QUANTITATIVE, PREGNANCY: hCG, Beta Chain, Quant, S: <1 m[IU]/mL (ref <5)

## 2016-06-26 MED ORDER — SERTRALINE HCL 50 MG PO TABS: 50.0000 mg | ORAL_TABLET | Freq: Every day | ORAL | Status: DC

## 2016-06-26 MED ORDER — HYDROXYZINE PAMOATE 25 MG PO CAPS: ORAL_CAPSULE | ORAL | Status: DC

## 2016-06-26 MED ORDER — QUETIAPINE FUMARATE 200 MG PO TABS: 200.0000 mg | ORAL_TABLET | Freq: Every day | ORAL | Status: DC

## 2016-06-26 NOTE — Progress Notes (Signed)
Recreation Therapy Notes  Date: 07.05.17 Time: 1:00 pm Location: Craft Room  Group Topic: Self-esteem  Goal Area(s) Addresses:  Patient will identify at least one positive trait about self. Patient will identity at least one healthy coping skill.  Behavioral Response: Attentive, Interactive  Intervention: All About Me  Activity: Patients were instructed to make an All About Me pamphlet with their life's motto, positive traits, healthy coping skills, and their support system.  Education: LRT educated patients on ways they can increase their self-esteem.  Education Outcome: Acknowledges education/In group clarification offered  Clinical Observations/Feedback: Patient completed activity by writing positive traits, healthy coping skills, and her support system. Patient contributed to group discussion by stating that it was difficult and easy to think of coping skills and why, and that it was easy to think of her support system.  Jacquelynn CreeGreene,Crystalmarie Yasin M, LRT/CTRS 06/26/2016 2:57 PM

## 2016-06-26 NOTE — Progress Notes (Signed)
  [facility] Discharge [facility] :  Will you be returning to the same living situation after discharge:  Yes,  home At discharge, do you have transportation home?: Yes,  mother Do you have the ability to pay for your medications: Yes,  insurance   Release of information consent forms completed and in the chart;  Patient's signature needed at discharge.  Patient to Follow up at: Follow-up Information    Follow up with [facility]  On 07/04/2016.   Why:  Your hospital follow up appointment is on Thursday June 13th at 12:00. If you cannot make this appointment, please call 48 hours in advance to reschedule.    Contact information:   [address], [address] [place], [place]  Phone: [phone] Fax: [phone]      Follow up with [facility] .   Why:  Please call if you need immediate help. They are available 24/7, even on holidays.    Contact information:   Phone: [phone]      Next level of care provider has access to [facility] Link:no  Safety Planning and Suicide Prevention discussed: Yes,  with mother and patient   Have you used any form of tobacco in the last 30 days? (Cigarettes, Smokeless Tobacco, Cigars, and/or Pipes): Yes  Has patient been referred to the Quitline?: Patient refused referral  Patient has been referred for addiction treatment: Yes  Jolane Bankhead L Falen Lehrmann MSW, LCSWA  06/26/2016, 11:08 AM

## 2016-06-26 NOTE — Tx Team (Signed)
Interdisciplinary Treatment Plan Update (Adult)  Date:  06/26/2016 Time Reviewed:  11:08 AM  Progress in Treatment: Attending groups: Yes. Participating in groups:  Yes. Taking medication as prescribed:  Yes. Tolerating medication:  Yes. Family/Significant othe contact made:  Yes, individual(s) contacted:  mother  Patient understands diagnosis:  Yes. Discussing patient identified problems/goals with staff:  Yes. Medical problems stabilized or resolved:  Yes. Denies suicidal/homicidal ideation: Yes. Issues/concerns per patient self-inventory:  No. Other:  New problem(s) identified: No, Describe:  NA  Discharge Plan or Barriers: Pt plans to return home and follow up with outpatient.    Reason for Continuation of Hospitalization: Anxiety Depression Medication stabilization Suicidal ideation  Comments:The patient has a history of depression and anxiety and has been inpatient at [facility] psychiatric associates. Her prior psychiatrist use to prescribe Seroquel for mood stabilization and Vistaril for anxiety. She now sees Dr. Einar Grad will just started her on the Zoloft. The patient reports that when on medications she does not experience any symptoms of depression or anxiety. She admits that she has been taking Xanax that is not prescribed. On the day of admission she was arguing with her boyfriend of 6 months and first took an overdose of Xanax. Reportedly she took 8 tablets of 2 mg Xanax. Then she tried to drink Drano. She initially admitted that it was a suicide attempt but to me she is claiming that she was just trying to get her boyfriend's attention. This is not a good relationship and for the entire 6 months they've been arguing. She decided to leave him and move in with her mother. She denies psychotic symptoms or symptoms suggestive of bipolar mania. She does report mood instability. She admitted to abusing Xanax. She denies alcohol or illicit substance use but her urine tox screen is not  back yet. Past psychiatric history. She was hospitalized at [facility] for a nervous breakdown with psychosis. She reports severe stressors at the time. She was treated with Seroquel 400 mg but found it too sedating. She was also treated with Zoloft and believe that it works well in the past. She was tried on Lamictal but developed an allergic reaction. She was continued on low-dose Seroquel by her current psychiatrist. Recently Zoloft was added to her regimen. The patient was diagnosed with bipolar 2 disorder previously at [facility]. Dr. Einar Grad, her current psychiatrist, believes that she has anxiety and depression. She denies ever attempting suicide.  Estimated length of stay: Pt will likely d/c today.   New goal(s): NA   Review of initial/current patient goals per problem list:   1.  Goal(s): Patient will participate in aftercare plan * Met: Yes * Target date: at discharge * As evidenced by: Patient will participate within aftercare plan AEB aftercare provider and housing plan at discharge being identified.   2.  Goal (s): Patient will exhibit decreased depressive symptoms and suicidal ideations. * Met: Yes *  Target date: at discharge * As evidenced by: Patient will utilize self rating of depression at 3 or below and demonstrate decreased signs of depression or be deemed stable for discharge by MD.   3.  Goal(s): Patient will demonstrate decreased signs and symptoms of anxiety. * Met: Yes * Target date: at discharge * As evidenced by: Patient will utilize self rating of anxiety at 3 or below and demonstrated decreased signs of anxiety, or be deemed stable for discharge by MD   Attendees: Patient:  Holly Farrell 7/5/201711:08 AM  Family:   7/5/201711:08 AM  Physician:   Dr. Bary Leriche  7/5/201711:08 AM  Nursing:   Alda Berthold, RN  7/5/201711:08 AM  Case Manager:   7/5/201711:08 AM  Counselor:   7/5/201711:08 AM  Other:  Wray Kearns, [place] 7/5/201711:08 AM  Other:  Everitt Amber, LRT  7/5/201711:08  AM  Other:   7/5/201711:08 AM  Other:  7/5/201711:08 AM  Other:  7/5/201711:08 AM  Other:  7/5/201711:08 AM  Other:  7/5/201711:08 AM  Other:  7/5/201711:08 AM  Other:  7/5/201711:08 AM  Other:   7/5/201711:08 AM   Scribe for Treatment Team:   Wray Kearns, MSW, [place]  06/26/2016, 11:08 AM

## 2016-06-26 NOTE — Progress Notes (Signed)
Discharge Note: Patient left unit at 2 pm with her mother.  Patient denied any suicidal ideations at time of discharge.  Patient discharge instructions reviewed and patient verbalized understanding.  Patient belongings reviewed with the patient and returned at time of discharge.

## 2016-06-26 NOTE — [facility] Suicide Risk Assessment (Signed)
[facility]   Principal Problem: <principal problem not specified> Discharge Diagnoses:  Patient Active Problem List   Diagnosis Date Noted  . Cannabis use disorder, moderate, dependence (HCC) [F12.20] 06/26/2016  . Sedative, hypnotic or anxiolytic use disorder, mild, abuse [F13.10] 06/26/2016  . Bipolar II disorder (HCC) [F31.81] 06/25/2016  . Suicide attempt by drug ingestion (HCC) [T50.902A] 06/25/2016  . Tobacco use disorder [F17.200] 06/25/2016    Total Time spent with patient: 1 hour  Musculoskeletal: Strength & Muscle Tone: within normal limits Gait & Station: normal Patient leans: N/A  Psychiatric Specialty Exam: Review of Systems  Psychiatric/Behavioral: Positive for substance abuse. The patient is nervous/anxious and has insomnia.   All other systems reviewed and are negative.   Blood pressure 125/86, pulse 68, temperature 97.8 F (36.6 C), temperature source Oral, resp. rate 16, height 5\' 3"  (1.6 m), weight 51.256 kg (113 lb), last menstrual period 06/12/2016, SpO2 100 %.Body mass index is 20.02 kg/(m^2).  See SRA.                                                     Mental Status Per Nursing Assessment::   On Admission:  Suicidal ideation indicated by patient, and she is information year so she stated he hardly eats she will just no unilateral she tries to get back to work and job she continued on all the jobs that she and his head he goes there she starts working and that she is no did not I [facility] she was congruent mania and U here with school. And is an older woman who was working at this is an she again increases were after her social diphenhydramine in the cold or something so she lost his job and he no period was because he "knows from depression and she underwent The so it was very important that time I always that she isn't working. A C-section because she will get upset according to her family will be  pretended that we don't know not talk to her neuropsychological AND SHE WORKED AT THIS DISSECTION ON). SHE BUT SHE WAS SO SICK WITH  Demographic Factors:  Adolescent or young adult, Caucasian and Unemployed  Loss Factors: Loss of significant relationship  Historical Factors: Impulsivity  Risk Reduction Factors:   Sense of responsibility to family, Living with another person, especially a relative, Positive social support and Positive therapeutic relationship  Continued Clinical Symptoms:  Bipolar Disorder:   Bipolar II Alcohol/Substance Abuse/Dependencies  Cognitive Features That Contribute To Risk:  None    Suicide Risk:  Minimal: No identifiable suicidal ideation.  Patients presenting with no risk factors but with morbid ruminations; may be classified as minimal risk based on the severity of the depressive symptoms    Plan Of Care/Follow-up recommendations:  Activity:  As tolerated. Diet:  Regular. Other:  Keep follow-up appointments.  Kristine LineaJolanta Jeanpaul Biehl, MD 06/26/2016, 9:26 AM

## 2016-06-26 NOTE — [facility] Suicide Risk Assessment (Signed)
[facility]   Nursing information obtained from:  Patient Demographic factors:  Caucasian Current Mental Status:  Suicidal ideation indicated by patient Loss Factors:  Loss of significant relationship Historical Factors:  NA Risk Reduction Factors:  Sense of responsibility to family, Positive social support  Total Time spent with patient: 1 hour Principal Problem: <principal problem not specified> Diagnosis:   Patient Active Problem List   Diagnosis Date Noted  . Bipolar II disorder (HCC) [F31.81] 06/25/2016  . Suicide attempt by drug ingestion (HCC) [T50.902A] 06/25/2016  . Tobacco use disorder [F17.200] 06/25/2016   Subjective Data: Suicide attempt.  Continued Clinical Symptoms:    The "Alcohol Use Disorders Identification Test", Guidelines for Use in Primary Care, Second Edition.  World Science writerHealth Organization [facility](WHO). Score between 0-7:  no or low risk or alcohol related problems. Score between 8-15:  moderate risk of alcohol related problems. Score between 16-19:  high risk of alcohol related problems. Score 20 or above:  warrants further diagnostic evaluation for alcohol dependence and treatment.   CLINICAL FACTORS:   Severe Anxiety and/or Agitation Depression:   Comorbid alcohol abuse/dependence Impulsivity Insomnia Alcohol/Substance Abuse/Dependencies   Musculoskeletal: Strength & Muscle Tone: within normal limits Gait & Station: normal Patient leans: N/A  Psychiatric Specialty Exam: Physical Exam  Nursing note and vitals reviewed.   Review of Systems  Psychiatric/Behavioral: Positive for substance abuse. The patient is nervous/anxious.   All other systems reviewed and are negative.   Blood pressure 125/86, pulse 68, temperature 97.8 F (36.6 C), temperature source Oral, resp. rate 16, height 5\' 3"  (1.6 m), weight 51.256 kg (113 lb), last menstrual period 06/12/2016, SpO2 100 %.Body mass index is 20.02 kg/(m^2).  General Appearance:  Casual  Eye Contact:  Good  Speech:  Clear and Coherent  Volume:  Normal  Mood:  Euthymic  Affect:  Appropriate  Thought Process:  Goal Directed  Orientation:  Full (Time, Place, and Person)  Thought Content:  WDL  Suicidal Thoughts:  No  Homicidal Thoughts:  No  Memory:  Immediate;   Fair Recent;   Fair Remote;   Fair  Judgement:  Impaired  Insight:  Present  Psychomotor Activity:  Normal  Concentration:  Concentration: Fair and Attention Span: Fair  Recall:  [employer]Fair  Fund of Knowledge:  Fair  Language:  Fair  Akathisia:  No  Handed:  Right  AIMS (if indicated):     Assets:  Communication Skills Desire for Improvement Financial Resources/Insurance Housing Physical Health Resilience Social Support Transportation Vocational/Educational  ADL's:  Intact  Cognition:  WNL  Sleep:  Number of Hours: 6      COGNITIVE FEATURES THAT CONTRIBUTE TO RISK:  None    SUICIDE RISK:   Minimal: No identifiable suicidal ideation.  Patients presenting with no risk factors but with morbid ruminations; may be classified as minimal risk based on the severity of the depressive symptoms  PLAN OF CARE: Hospital admission, medication management, substance abuse counseling, discharge planning.  Holly Farrell is a 20 year old female with history of depression, anxiety, mood instability and substance abuse admitted after suicide attempt by overdose in the context of relationship problems.  1. Suicidal ideation. The patient adamantly denies any thoughts, intentions, or plans to hurt herself or others. She is able to contract for safety. She is forward thinking and optimistic about the future. She is a loving daughter.  2. Mood. The patient was continued on Seroquel for mood stabilization and Zoloft for depression.  3. Insomnia. She responded well  to Vistaril.  4. Benzodiazepine abuse. There are no symptoms of benzodiazepine withdrawal. Vital signs are stable.   5. Substance abuse treatment. The  patient is interested in outpatient treatment.   6. Smoking. Nicotine patch is available.  7. Disposition. She will be discharged to home with her parents. She will follow up with Dr. Daleen Boavi for medication management and [facility] for [facility] IOP.   I certify that inpatient services furnished can reasonably be expected to improve the patient's condition.   Kristine LineaJolanta Jerson Furukawa, MD 06/26/2016, 9:10 AM

## 2016-06-26 NOTE — Plan of Care (Signed)
Problem: Medication: Goal: Compliance with prescribed medication regimen will improve Outcome: Progressing Patient has been compliant with medication since admission.    
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
## 2016-06-26 NOTE — BHH Counselor (Signed)
  PSA was not completed. Pt discharged within 24 hours of admission.   Rondall Allegraandace L Madhavi Hamblen, MSW, [place]CSWA

## 2016-06-26 NOTE — H&P (Signed)
Psychiatric Admission Assessment Adult  Patient Identification: Holly Farrell MRN:  [id number] Date of Evaluation:  06/26/2016 Chief Complaint:  bipolar 2 Principal Diagnosis: <principal problem not specified> Diagnosis:   Patient Active Problem List   Diagnosis Date Noted  . Cannabis use disorder, moderate, dependence (HCC) [F12.20] 06/26/2016  . Sedative, hypnotic or anxiolytic use disorder, mild, abuse [F13.10] 06/26/2016  . Bipolar II disorder (HCC) [F31.81] 06/25/2016  . Suicide attempt by drug ingestion (HCC) [T50.902A] 06/25/2016  . Tobacco use disorder [F17.200] 06/25/2016   History of Present Illness:   Identifying data. Holly Farrell is a 20 y.o. female with a history of depression, anxiety and mood instability.  Chief complaint. "I was trying to get his attention."  History of present illness. Information was obtained from the patient and the chart. The patient has a history of depression and anxiety and [facility]. Her prior psychiatrist use to prescribe Seroquel for mood stabilization and Vistaril for anxiety. She now sees Dr. Daleen Boavi will just started her on the Zoloft. The patient reports that when on medications she does not experience any symptoms of depression or anxiety. She admits that she has been taking Xanax that is not prescribed. On the day of admission she was arguing with her boyfriend of 6 months and first took an overdose of Xanax. Reportedly she took 8 tablets of 2 mg Xanax. Then she tried to drink Drano. She initially admitted that it was a suicide attempt but to me she is claiming that she was just trying to get her boyfriend's attention. This is not a good relationship and for the entire 6 months they've been arguing. She decided to leave him and move in with her mother. She denies psychotic symptoms or symptoms suggestive of bipolar mania. She does report mood instability. She admitted to abusing Xanax. She denies alcohol  or illicit substance use but her urine tox screen is not back yet.  Past psychiatric history. [facility] with psychosis. She reports severe stressors at the time. She was treated with Seroquel 400 mg but found it too sedating. She was also treated with Zoloft and believe that it works well in the past. She was tried on Lamictal but developed an allergic reaction. She was continued on low-dose Seroquel by her current psychiatrist. Recently Zoloft was added to her regimen. The patient was [facility] with [facility] 2 [facility] Dr. Daleen Boavi, her current psychiatrist, believes that she has anxiety and depression. She denies ever attempting suicide.  Family psychiatric history. Her grandfather committed suicide, mother has bipolar, multiple family members with anxiety.  Social history. She graduated from high school and went to college for a year. She will return to college in September. She lives with her boyfriend of 6 months but wants to end this relationship. She will be moving back with her mother. She does have health insurance  Total Time spent with patient: 1 hour  Is the patient at risk to self? No.  Has the patient been a risk to self in the past 6 months? Yes.    Has the patient been a risk to self within the distant past? No.  Is the patient a risk to others? No.  Has the patient been a risk to others in the past 6 months? No.  Has the patient been a risk to others within the distant past? No.   Prior Inpatient Therapy:   Prior Outpatient Therapy:  Alcohol Screening: 1. How often do you have a drink containing alcohol?: Monthly or less 2. How many drinks containing alcohol do you have on a typical day when you are drinking?: 3 or 4 3. How often do you have six or more drinks on one occasion?: Never Preliminary Score: 1 Brief Intervention: AUDIT score less than 7 or less-screening does not suggest unhealthy drinking-brief intervention  not indicated Substance Abuse History in the last 12 months:  Yes.   Consequences of Substance Abuse: Negative Previous Psychotropic Medications: Yes  Psychological Evaluations: No  Past Medical History:  Past Medical History  Diagnosis Date  . Anxiety   . Depression   . Bipolar disorder [facility])     Past Surgical History  Procedure Laterality Date  . Tonsillectomy     Family History:  Family History  Problem Relation Age of Onset  . Depression Mother   . Anxiety disorder Mother   . Bipolar disorder Mother   . Hypertension Mother   . Asthma Father   . Alcohol abuse Father   . Sleep disorder Brother   . Bipolar disorder Maternal Grandfather   . Hypertension Maternal Grandmother    Tobacco Screening: @FLOW ([phone])::1)@ Social History:  History  Alcohol Use  . 0.0 - 7.2 oz/week  . 0 Glasses of wine, 0-8 Cans of beer, 0-4 Shots of liquor, 0 Standard drinks or equivalent per week     History  Drug Use  . Yes  . Special: Marijuana, Cocaine    Comment: marjuana last used yesterday cocaine last used 12-24-15 also uses Xanax    Additional Social History:                           Allergies:   Allergies  Allergen Reactions  . Codeine     Patient stats she might have had an allergic reaction at 20 y/o, but then states she took it when older with a mild issue (nausea and vomiting)  . Lamictal [Lamotrigine] Rash   Lab Results:  Results for orders placed or performed during the hospital encounter of 06/25/16 (from the past 48 hour(s))  Urinalysis complete, with microscopic ([facility] only)     Status: Abnormal   Collection Time: 06/25/16  4:44 PM  Result Value Ref Range   Color, Urine YELLOW (A) YELLOW   APPearance CLEAR (A) CLEAR   Glucose, UA NEGATIVE NEGATIVE mg/dL   Bilirubin Urine NEGATIVE NEGATIVE   Ketones, ur NEGATIVE NEGATIVE mg/dL   Specific Gravity, Urine 1.016 1.005 - 1.030   Hgb urine dipstick NEGATIVE NEGATIVE   pH 5.0 5.0 - 8.0   Protein, ur  NEGATIVE NEGATIVE mg/dL   Nitrite NEGATIVE NEGATIVE   Leukocytes, UA NEGATIVE NEGATIVE   RBC / HPF NONE SEEN 0 - 5 RBC/hpf   WBC, UA NONE SEEN 0 - 5 WBC/hpf   Bacteria, UA NONE SEEN NONE SEEN   Squamous Epithelial / LPF 0-5 (A) NONE SEEN   Mucous PRESENT   Urine Drug Screen, Qualitative ([facility] only)     Status: Abnormal   Collection Time: 06/25/16  4:44 PM  Result Value Ref Range   Tricyclic, Ur Screen NONE DETECTED NONE DETECTED   Amphetamines, Ur Screen NONE DETECTED NONE DETECTED   MDMA (Ecstasy)Ur Screen NONE DETECTED NONE DETECTED   Cocaine Metabolite,Ur Holden NONE DETECTED NONE DETECTED   Opiate, Ur Screen NONE DETECTED NONE DETECTED   Phencyclidine (PCP) Ur S NONE DETECTED NONE DETECTED   Cannabinoid 50  Ng, Ur [place] POSITIVE (A) NONE DETECTED   Barbiturates, Ur Screen NONE DETECTED NONE DETECTED   Benzodiazepine, Ur Scrn POSITIVE (A) NONE DETECTED   Methadone Scn, Ur NONE DETECTED NONE DETECTED    Comment: (NOTE) 100  Tricyclics, urine               Cutoff 1000 ng/mL 200  Amphetamines, urine             Cutoff 1000 ng/mL 300  MDMA (Ecstasy), urine           Cutoff 500 ng/mL 400  Cocaine Metabolite, urine       Cutoff 300 ng/mL 500  Opiate, urine                   Cutoff 300 ng/mL 600  Phencyclidine (PCP), urine      Cutoff 25 ng/mL 700  Cannabinoid, urine              Cutoff 50 ng/mL 800  Barbiturates, urine             Cutoff 200 ng/mL 900  Benzodiazepine, urine           Cutoff 200 ng/mL 1000 Methadone, urine                Cutoff 300 ng/mL 1100 1200 The urine drug screen provides only a preliminary, unconfirmed 1300 analytical test result and should not be used for non-medical 1400 purposes. Clinical consideration and professional judgment should 1500 be applied to any positive drug screen result due to possible 1600 interfering substances. A more specific alternate chemical method 1700 must be used in order to obtain a confirmed analytical result.  1800 Gas chromato graphy  / mass spectrometry (GC/MS) is the preferred 1900 confirmatory method.     Blood Alcohol level:  Lab Results  Component Value Date   ETH <5 06/24/2016    Metabolic Disorder Labs:  No results found for: HGBA1C, MPG No results found for: PROLACTIN Lab Results  Component Value Date   CHOL 230* 10/30/2015   TRIG 76 10/30/2015   HDL 62 10/30/2015   CHOLHDL 3.7 10/30/2015   VLDL 15 10/30/2015   LDLCALC 153* 10/30/2015    Current Medications: Current Facility-Administered Medications  Medication Dose Route Frequency Provider Last Rate Last Dose  . acetaminophen (TYLENOL) tablet 650 mg  650 mg Oral Q6H PRN Jolanta B Pucilowska, MD      . alum & mag hydroxide-simeth (MAALOX/MYLANTA) 200-200-20 MG/5ML suspension 30 mL  30 mL Oral Q4H PRN Jolanta B Pucilowska, MD      . hydrOXYzine (ATARAX/VISTARIL) tablet 25 mg  25 mg Oral Q6H PRN Shari Prows, MD   25 mg at 06/25/16 2141  . magnesium hydroxide (MILK OF MAGNESIA) suspension 30 mL  30 mL Oral Daily PRN Jolanta B Pucilowska, MD      . nicotine (NICODERM CQ - dosed in mg/24 hours) patch 21 mg  21 mg Transdermal Q0600 Shari Prows, MD   21 mg at 06/25/16 1948  . QUEtiapine (SEROQUEL) tablet 200 mg  200 mg Oral QHS Jolanta B Pucilowska, MD      . sertraline (ZOLOFT) tablet 50 mg  50 mg Oral Daily Cindi Carbon, RPH   50 mg at 06/25/16 1729   PTA Medications: Prescriptions prior to admission  Medication Sig Dispense Refill Last Dose  . hydrOXYzine (VISTARIL) 25 MG capsule Take one to two capsules twice daily as needed for anxiety. 120 capsule 1   .  QUEtiapine (SEROQUEL XR) 50 MG TB24 24 hr tablet Take 1 tablet (50 mg total) by mouth at bedtime. 30 each 1   . sertraline (ZOLOFT) 25 MG tablet Take 1 tablet (25 mg total) by mouth daily. 30 tablet 2     Musculoskeletal: Strength & Muscle Tone: within normal limits Gait & Station: normal Patient leans: N/A  Psychiatric Specialty Exam: Physical Exam  Nursing note and  vitals reviewed. Constitutional: She is oriented to person, place, and time. She appears well-developed and well-nourished.  HENT:  Head: Normocephalic and atraumatic.  Eyes: Conjunctivae and EOM are normal. Pupils are equal, round, and reactive to light.  Neck: Normal range of motion. Neck supple.  Cardiovascular: Normal rate, regular rhythm and normal heart sounds.   Respiratory: Effort normal and breath sounds normal.  GI: Soft. Bowel sounds are normal.  Musculoskeletal: Normal range of motion.  Neurological: She is alert and oriented to person, place, and time.  Skin: Skin is warm and dry.    Review of Systems  Psychiatric/Behavioral: Positive for depression and substance abuse. The patient is nervous/anxious and has insomnia.   All other systems reviewed and are negative.   Blood pressure 125/86, pulse 68, temperature 97.8 F (36.6 C), temperature source Oral, resp. rate 16, height 5\' 3"  (1.6 m), weight 51.256 kg (113 lb), last menstrual period 06/12/2016, SpO2 100 %.Body mass index is 20.02 kg/(m^2).  See SRA.                                                  Sleep:  Number of Hours: 6       Treatment Plan Summary: Daily contact with patient to assess and evaluate symptoms and progress in treatment and Medication management   Holly Farrell is a 20 year old female with history of depression, anxiety, mood instability and substance abuse admitted after suicide attempt by overdose in the context of relationship problems.  1. Suicidal ideation. The patient adamantly denies any thoughts, intentions, or plans to hurt herself or others. She is able to contract for safety. She is forward thinking and optimistic about the future. She is a loving daughter.  2. Mood. The patient was continued on Seroquel for mood stabilization and Zoloft for depression.  3. Insomnia. She responded well to Vistaril.  4. Benzodiazepine abuse. There are no symptoms of benzodiazepine  withdrawal. Vital signs are stable.   5. Substance abuse treatment. The patient is interested in outpatient treatment.   6. Smoking. Nicotine patch is available.  7. Metabolic syndromes screening. Lipid profile, TSH, hemoglobin A1c and prolactin are pending.   8. Pregnancy. Pregnancy test was not ordered in the emergency room. We ordered HCG.   9. Disposition. She will be discharged to home with her parents. She will follow up with Dr. Daleen Boavi for medication management and [facility] for SA IOP.   Observation Level/Precautions:  15 minute checks  Laboratory:  CBC Chemistry Profile HCG UDS UA  Psychotherapy:    Medications:    Consultations:    Discharge Concerns:    Estimated LOS:  Other:     I certify that inpatient services furnished can reasonably be expected to improve the patient's condition.    Kristine LineaJolanta Pucilowska, MD 7/5/20179:19 AM

## 2016-06-26 NOTE — Discharge Summary (Addendum)
Physician Discharge Summary Note  Patient:  Holly Farrell is an 20 y.o., female MRN:  [id number] DOB:  1996-03-21 Patient phone:  [phone] (home)  Patient address:   [address] [place] [id number],  Total Time spent with patient: 1 hour  Date of Admission:  06/25/2016 Date of Discharge: 06/26/2016  Reason for Admission:  Suicide attempt.  Identifying data. Holly Farrell is a 20 y.o. female with a history of depression, anxiety and mood instability.  Chief complaint. "I was trying to get his attention."  History of present illness. Information was obtained from the patient and the chart. The patient has a history of depression and anxiety and has been inpatient at [facility]. Her prior psychiatrist use to prescribe Seroquel for mood stabilization and Vistaril for anxiety. She now sees Dr. Daleen Boavi will just started her on the Zoloft. The patient reports that when on medications she does not experience any symptoms of depression or anxiety. She admits that she has been taking Xanax that is not prescribed. On the day of admission she was arguing with her boyfriend of 6 months and first took an overdose of Xanax. Reportedly she took 8 tablets of 2 mg Xanax. Then she tried to drink Drano. She initially admitted that it was a suicide attempt but to me she is claiming that she was just trying to get her boyfriend's attention. This is not a good relationship and for the entire 6 months they've been arguing. She decided to leave him and move in with her mother. She denies psychotic symptoms or symptoms suggestive of bipolar mania. She does report mood instability. She admitted to abusing Xanax. She denies alcohol or illicit substance use but her urine tox screen is not back yet.  Past psychiatric history. [facility] for a nervous breakdown with psychosis. She reports severe stressors at the time. She was treated with Seroquel 400 mg but found it too sedating. She  was also treated with Zoloft and believe that it works well in the past. She was tried on Lamictal but developed an allergic reaction. She was continued on low-dose Seroquel by her current psychiatrist. Recently Zoloft was added to her regimen. The patient was [facility] with [facility] Dr. Daleen Boavi, her current psychiatrist, believes that she has anxiety and depression. She denies ever attempting suicide.  Family psychiatric history. Her grandfather committed suicide, mother has bipolar, multiple family members with anxiety.  Social history. She graduated from high school and went to college for a year. She will return to college in September. She lives with her boyfriend of 6 months but wants to end this relationship. She will be moving back with her mother. She does have health insurance  Principal Problem: Bipolar II disorder [facility](HCC) Discharge Diagnoses: Patient Active Problem List   Diagnosis Date Noted  . Cannabis use disorder, moderate, dependence (HCC) [F12.20] 06/26/2016  . Sedative, hypnotic or anxiolytic use disorder, mild, abuse [F13.10] 06/26/2016  . Bipolar II disorder (HCC) [F31.81] 06/25/2016  . Suicide attempt by drug ingestion (HCC) [T50.902A] 06/25/2016  . Tobacco use disorder [F17.200] 06/25/2016    Past Medical History:  Past Medical History  Diagnosis Date  . Anxiety   . Depression   . Bipolar disorder [facility](HCC)     Past Surgical History  Procedure Laterality Date  . Tonsillectomy     Family History:  Family History  Problem Relation Age of Onset  . Depression Mother   . Anxiety disorder  Mother   . Bipolar disorder Mother   . Hypertension Mother   . Asthma Father   . Alcohol abuse Father   . Sleep disorder Brother   . Bipolar disorder Maternal Grandfather   . Hypertension Maternal Grandmother    Social History:  History  Alcohol Use  . 0.0 - 7.2 oz/week  . 0 Glasses of wine, 0-8 Cans of beer, 0-4 Shots of liquor, 0 Standard drinks or  equivalent per week     History  Drug Use  . Yes  . Special: Marijuana, Cocaine    Comment: marjuana last used yesterday cocaine last used 12-24-15 also uses Xanax    Social History   Social History  . Marital Status: Single    Spouse Name: N/A  . Number of Children: N/A  . Years of Education: N/A   Social History Main Topics  . Smoking status: Current Every Day Smoker -- 0.50 packs/day    Types: Cigarettes    Start date: 10/03/2014  . Smokeless tobacco: Never Used  . Alcohol Use: 0.0 - 7.2 oz/week    0 Glasses of wine, 0-8 Cans of beer, 0-4 Shots of liquor, 0 Standard drinks or equivalent per week  . Drug Use: Yes    Special: Marijuana, Cocaine     Comment: marjuana last used yesterday cocaine last used 12-24-15 also uses Xanax  . Sexual Activity: Yes    Birth Control/ Protection: None, Pill   Other Topics Concern  . None   Social History Narrative    Hospital Course:    Holly Farrell is a 20 year old female with history of depression, anxiety, mood instability and substance abuse admitted after suicide attempt by overdose in the context of relationship problems.  1. Suicidal ideation. The patient adamantly denies any thoughts, intentions, or plans to hurt herself or others. She is able to contract for safety. She is forward thinking and optimistic about the future. She is a loving daughter.  2. Mood. The patient was continued on Seroquel for mood stabilization and Zoloft for depression.  3. Insomnia. She responded well to Vistaril.  4. Benzodiazepine abuse. There are no symptoms of benzodiazepine withdrawal. Vital signs are stable.   5. Substance abuse treatment. The patient is interested in outpatient treatment.   6. Smoking. Nicotine patch is available.  7. Metabolic syndromes screening. Lipid profile and TSH are normal. Hemoglobin A1c and prolactin are pending.   8. Pregnancy. Pregnancy test was negative.    9. Disposition. She will be discharged to home with  her parents. She will follow up with Dr. Daleen Boavi for medication management and [facility].   Physical Findings: AIMS: Facial and Oral Movements Muscles of Facial Expression: None, normal Lips and Perioral Area: None, normal Jaw: None, normal Tongue: None, normal,Extremity Movements Upper (arms, wrists, hands, fingers): None, normal Lower (legs, knees, ankles, toes): None, normal, Trunk Movements Neck, shoulders, hips: None, normal, Overall Severity Severity of abnormal movements (highest score from questions above): None, normal Incapacitation due to abnormal movements: None, normal Patient's awareness of abnormal movements (rate only patient's report): No Awareness, Dental Status Current problems with teeth and/or dentures?: No Does patient usually wear dentures?: No  CIWA:  CIWA-Ar Total: 0 COWS:  COWS Total Score: 0  Musculoskeletal: Strength & Muscle Tone: within normal limits Gait & Station: normal Patient leans: N/A  Psychiatric Specialty Exam: Physical Exam  Nursing note and vitals reviewed.   Review of Systems  Psychiatric/Behavioral: Positive for substance abuse. The patient is  nervous/anxious and has insomnia.   All other systems reviewed and are negative.   Blood pressure 125/86, pulse 68, temperature 97.8 F (36.6 C), temperature source Oral, resp. rate 16, height  (1.6 m), weight 51.256 kg (113 lb), last menstrual period 06/12/2016, SpO2 100 %.Body mass index is 20.02 kg/(m^2).  See SRA.                                                  Sleep:  Number of Hours: 6     Have you used any form of tobacco in the last 30 days? (Cigarettes, Smokeless Tobacco, Cigars, and/or Pipes): Yes  Has this patient used any form of tobacco in the last 30 days? (Cigarettes, Smokeless Tobacco, Cigars, and/or Pipes) Yes, Yes, A prescription for an FDA-approved tobacco cessation medication was offered at discharge and the patient refused  Blood  Alcohol level:  Lab Results  Component Value Date   ETH <5 06/24/2016    Metabolic Disorder Labs:  No results found for: HGBA1C, MPG No results found for: PROLACTIN Lab Results  Component Value Date   CHOL 230* 10/30/2015   TRIG 76 10/30/2015   HDL 62 10/30/2015   CHOLHDL 3.7 10/30/2015   VLDL 15 10/30/2015   LDLCALC 153* 10/30/2015    See Psychiatric Specialty Exam and Suicide Risk Assessment completed by Attending Physician prior to discharge.  Discharge destination:  Home  Is patient on multiple antipsychotic therapies at discharge:  No   Has Patient had three or more failed trials of antipsychotic monotherapy by history:  No  Recommended Plan for Multiple Antipsychotic Therapies: NA  Discharge Instructions    Diet - low sodium heart healthy    Complete by:  As directed      Increase activity slowly    Complete by:  As directed             Medication List    STOP taking these medications        QUEtiapine 50 MG Tb24 24 hr tablet  Commonly known as:  SEROQUEL XR  Replaced by:  QUEtiapine 200 MG tablet      TAKE these medications      Indication   hydrOXYzine 25 MG capsule  Commonly known as:  VISTARIL  Take one to two capsules twice daily as needed for anxiety.   Indication:  Anxiety Neurosis     QUEtiapine 200 MG tablet  Commonly known as:  SEROQUEL  Take 1 tablet (200 mg total) by mouth at bedtime.   Indication:  Depressive Phase of Manic-Depression     sertraline 50 MG tablet  Commonly known as:  ZOLOFT  Take 1 tablet (50 mg total) by mouth daily.   Indication:  Major Depressive Disorder         Follow-up recommendations:  Activity:  As tolerated. Diet:  Regular. Other:  Keep follow-up appointments.  Comments:    Signed: Kristine Linea, MD 06/26/2016, 9:33 AM

## 2016-06-26 NOTE — [facility] Suicide Risk Assessment (Signed)
[facility] INPATIENT:  Family/Significant Other Suicide Prevention Education  Suicide Prevention Education:  Education Completed; Holly PancoastDiana Farrell (mother) has been identified by the patient as the family member/significant other with whom the patient will be residing, and identified as the person(s) who will aid the patient in the event of a mental health crisis (suicidal ideations/suicide attempt).  With written consent from the patient, the family member/significant other has been provided the following suicide prevention education, prior to the and/or following the discharge of the patient.  The suicide prevention education provided includes the following:  Suicide risk factors  Suicide prevention and interventions  National Suicide Hotline telephone number  [facility] assessment telephone number  [facility] Emergency Assistance 911  [facility] and/or Residential Mobile Crisis Unit telephone number  Request made of family/significant other to:  Remove weapons (e.g., guns, rifles, knives), all items previously/currently identified as safety concern.    Remove drugs/medications (over-the-counter, prescriptions, illicit drugs), all items previously/currently identified as a safety concern.  The family member/significant other verbalizes understanding of the suicide prevention education information provided.  The family member/significant other agrees to remove the items of safety concern listed above.  Holly Farrell Holly Farrell MSW, LCSWA  06/26/2016, 10:51 AM

## 2016-06-26 NOTE — [facility] Group Notes (Signed)
[facility] Group Notes:  (Nursing/MHT/Case Management/Adjunct)  Date:  06/26/2016  Time:  2:21 AM  Type of Therapy:  Group Therapy  Participation Level:  Active  Participation Quality:  Appropriate  Affect:  Appropriate  Cognitive:  Appropriate  Insight:  Good  Engagement in Group:  Engaged  Modes of Intervention:  n/a  Summary of Progress/Problems: Wrap up group was spent outside. PT played basketball with fellow pts.    Fanny Skatesshley Imani Wateen Varon 06/26/2016, 2:21 AM

## 2016-06-27 LAB — PROLACTIN: PROLACTIN: 43.9 ng/mL — AB (ref 4.8–23.3)

## 2016-07-04 ENCOUNTER — Ambulatory Visit: Payer: 59 | Admitting: Psychiatry

## 2016-07-26 ENCOUNTER — Ambulatory Visit (INDEPENDENT_AMBULATORY_CARE_PROVIDER_SITE_OTHER): Payer: 59 | Admitting: Psychiatry

## 2016-07-26 DIAGNOSIS — F3132 Bipolar disorder, current episode depressed, moderate: Secondary | ICD-10-CM

## 2016-07-26 DIAGNOSIS — F1394 Sedative, hypnotic or anxiolytic use, unspecified with sedative, hypnotic or anxiolytic-induced mood disorder: Secondary | ICD-10-CM | POA: Diagnosis not present

## 2016-07-26 MED ORDER — SERTRALINE HCL 50 MG PO TABS: 50.0000 mg | ORAL_TABLET | Freq: Every day | ORAL | 0 refills | Status: DC

## 2016-07-26 MED ORDER — PRENATAL MULTIVITAMIN CH: 1.0000 | ORAL_TABLET | Freq: Every day | ORAL | Status: DC

## 2016-07-26 MED ORDER — QUETIAPINE FUMARATE 100 MG PO TABS: 100.0000 mg | ORAL_TABLET | Freq: Every day | ORAL | 0 refills | Status: DC

## 2016-07-26 NOTE — Progress Notes (Signed)
Patient ID: Holly Farrell, female   DOB: 1996-07-31, 20 y.o.   MRN: [id number]  [facility] MD/PA/NP OP Progress Note  07/26/2016 9:46 AM SAKIYA HULM  MRN:  [id number]  Subjective:  Patient is a 20 year old female with history of bipolar disorder and Xanax abuse who was recently discharged from the inpatient unit. She is currently following Dr. Daleen Bo  but she is out of the office. Patient has been calling our office as she reported that she is currently pregnant and she has questions about her medications.  She reported that after her discharge from the inpatient unit her mother kicked her out and she is currently staying with her father. Her mother wants her to go to the rehabilitation program due to her history of abuse of Xanax. She has attempted suicide by using Xanax couple of times in the past. She reported that she is excited about her pregnancy but does not know where the father will stay in her life at this time. Her periods are supportive. She reported that she feels depressed at this time but does not have any thoughts to hurt herself. She is compliant with her medications and wants to continue taking them as prescribed.  She reported that she is having morning sickness including nausea related to her pregnancy. She is also interested in going to the IOP program in [place] as she does not want to go to the rehabilitation program at this time. She stated that her mother will come over this weekend and they all want to sit together to discuss about the best course of functioning for her .  She appeared calm and alert during the interview.    Chief Complaint: Follow-up  Visit Diagnosis:     ICD-9-CM ICD-10-CM   1. Bipolar affective disorder, currently depressed, moderate (HCC) 296.52 F31.32   2. 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 Anxiety   . Bipolar disorder (HCC)    . Depression     Past Surgical History:  Procedure Laterality Date  . TONSILLECTOMY     Family History:  Family History  Problem Relation Age of Onset  . Depression Mother   . Anxiety disorder Mother   . Bipolar disorder Mother   . Hypertension Mother   . Asthma Father   . Alcohol abuse Father   . Sleep disorder Brother   . Bipolar disorder Maternal Grandfather   . Hypertension Maternal Grandmother    Social History:  Social History   Social History  . Marital status: Single    Spouse name: N/A  . Number of children: N/A  . Years of education: N/A   Social History Main Topics  . Smoking status: Current Every Day Smoker    Packs/day: 0.50    Types: Cigarettes    Start date: 10/03/2014  . Smokeless tobacco: Never Used  . Alcohol use 0.0 - 7.2 oz/week  . Drug use:     Types: Marijuana, Cocaine     Comment: marjuana last used yesterday cocaine last used 12-24-15 also uses Xanax  . Sexual activity: Yes    Birth control/ protection: None, Pill   Other Topics Concern  . Not on file   Social History Narrative  . No narrative on file   Additional History:   Assessment:   Musculoskeletal: Strength & Muscle Tone: within normal limits Gait & Station: normal Patient leans:  N/A  Psychiatric Specialty Exam: Anxiety  Patient reports no insomnia or suicidal ideas.    Drug Problem  Pertinent negatives include no hallucinations.    Review of Systems  Psychiatric/Behavioral: Negative for depression, hallucinations, memory loss, substance abuse and suicidal ideas. The patient does not have insomnia.        Patient doing well today however per mother there were periods where patient has been irritable and had mood fluctuations.  All other systems reviewed and are negative.   There were no vitals taken for this visit.There is no height or weight on file to calculate BMI.  General Appearance: Well Groomed  Eye Contact:  Good  Speech:  Clear and Coherent  Volume:   Normal  Mood:  Depressed   Affect:  Smiling   Thought Process:  Linear and Logical  Orientation:  Full (Time, Place, and Person)  Thought Content:  Negative  Suicidal Thoughts:  No  Homicidal Thoughts:  No  Memory:  Immediate;   Good Recent;   Good Remote;   Good  Judgement:  Good  Insight:  Good  Psychomotor Activity:  Negative  Concentration:  Good  Recall:  Good  Fund of Knowledge: Good  Language: Good  Akathisia:  Negative  Handed:    AIMS (if indicated):    Assets:  Communication Skills Desire for Improvement Social Support Vocational/Educational  ADL's:  Intact  Cognition: WNL  Sleep:  good   Is the patient at risk to self?  No. Has the patient been a risk to self in the past 6 months?  No. Has the patient been a risk to self within the distant past?  Yes.   Is the patient a risk to others?  No. Has the patient been a risk to others in the past 6 months?  No. Has the patient been a risk to others within the distant past?  No.  Current Medications: Current Outpatient Prescriptions  Medication Sig Dispense Refill  . hydrOXYzine (VISTARIL) 25 MG capsule Take one to two capsules twice daily as needed for anxiety. 120 capsule 1  . QUEtiapine (SEROQUEL) 200 MG tablet Take 1 tablet (200 mg total) by mouth at bedtime. 30 tablet 0  . sertraline (ZOLOFT) 50 MG tablet Take 1 tablet (50 mg total) by mouth daily. 30 tablet 0   No current facility-administered medications for this visit.     Medical Decision Making:  Established Problem, Stable/Improving (1), Review of Medication Regimen & Side Effects (2) and Review of New Medication or Change in Dosage (2)  Treatment Plan Summary:Medication management and Plan   Bipolar disorder, most recent episode depressed- Continue Zoloft 50 mg daily. Prescription was given to her Decrease Seroquel 100 mg by mouth daily at bedtime Discontinue Vistaril at this time Patient will be given information about the IOP program in  [place] and she will discuss with [facility] therapist in the office  Advised patient to call if she needs further appointments otherwise she will follow-up with Dr. Daleen Bo  upon her return  Follow-up in 4 weeks or earlier depending on her symptoms   More than 50% of the time spent in psychoeducation, counseling and coordination of care.    This note was generated in part or whole with voice recognition software. Voice regonition is usually quite accurate but there are transcription errors that can and very often do occur. I apologize for any typographical errors that were not detected and corrected.   Brandy Hale 07/26/2016, 9:46 AM

## 2016-08-17 ENCOUNTER — Other Ambulatory Visit: Payer: Self-pay | Admitting: Psychiatry

## 2016-09-07 ENCOUNTER — Other Ambulatory Visit: Payer: Self-pay | Admitting: Psychiatry

## 2016-09-09 ENCOUNTER — Other Ambulatory Visit (HOSPITAL_COMMUNITY): Payer: Self-pay | Admitting: Psychiatry

## 2016-09-09 ENCOUNTER — Encounter: Payer: Self-pay | Admitting: Psychiatry

## 2016-09-09 ENCOUNTER — Ambulatory Visit (INDEPENDENT_AMBULATORY_CARE_PROVIDER_SITE_OTHER): Payer: 59 | Admitting: Psychiatry

## 2016-09-09 ENCOUNTER — Telehealth: Payer: Self-pay | Admitting: Psychiatry

## 2016-09-09 VITALS — BP 140/81 | HR 83 | Temp 98.4°F | Ht 63.0 in | Wt 121.6 lb

## 2016-09-09 DIAGNOSIS — F3132 Bipolar disorder, current episode depressed, moderate: Secondary | ICD-10-CM | POA: Diagnosis not present

## 2016-09-09 DIAGNOSIS — F1394 Sedative, hypnotic or anxiolytic use, unspecified with sedative, hypnotic or anxiolytic-induced mood disorder: Secondary | ICD-10-CM

## 2016-09-09 MED ORDER — QUETIAPINE FUMARATE 100 MG PO TABS: 100.0000 mg | ORAL_TABLET | Freq: Every day | ORAL | 0 refills | Status: DC

## 2016-09-09 MED ORDER — SERTRALINE HCL 50 MG PO TABS: 50.0000 mg | ORAL_TABLET | Freq: Every day | ORAL | 0 refills | Status: DC

## 2016-09-09 MED ORDER — QUETIAPINE FUMARATE 100 MG PO TABS: ORAL_TABLET | ORAL | 1 refills | Status: DC

## 2016-09-09 NOTE — Telephone Encounter (Signed)
called pt let her know that both rx were called into [facility] [address]

## 2016-09-09 NOTE — Progress Notes (Signed)
Patient ID: Holly Farrell, female   DOB: Aug 30, 1996, 20 y.o.   MRN: [id number]  [facility] MD/PA/NP OP Progress Note  09/09/2016 9:13 AM LILLER YOHN  MRN:  [id number]  Subjective:  Patient is a 20 year old female with history of bipolar disorder and Xanax abuse. Patient was seen today for a follow-up. She was seen in early August by Dr.Faheem in this clinician's absence. At that time she had disclosed that she was pregnant and wanted to ascertain the safety of the medication she was on. She was continued on Zoloft at 50 mg and the Seroquel was reduced to 100 mg. Patient today reports that her mood has been good and that she has been drug free since the last 6 weeks. States that  she has moved back with her mother who is supportive and has been able to stay away from drugs. However reports she has some trouble sleeping. She has not started with therapist. States that she is not going to IOP since it does not work for her at her work schedule. She denies any mood symptoms or anxiety symptoms. States that she has been thinking about how her time in the past revolved around using drugs and how she would divide up her money to mostly spend on drugs. States that her mindset is different now and she would like to go to school and complete a degree. She denies any suicidal thoughts.  Chief Complaint: Trouble sleeping Chief Complaint    Follow-up; Medication Refill     Visit Diagnosis:     ICD-9-CM ICD-10-CM   1. Bipolar affective disorder, currently depressed, moderate (HCC) 296.52 F31.32   2. 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 Anxiety   . Bipolar disorder (HCC)   . Depression     Past Surgical History:  Procedure Laterality Date  . TONSILLECTOMY     Family History:  Family History  Problem Relation Age of Onset  . Depression Mother   . Anxiety disorder Mother   . Bipolar  disorder Mother   . Hypertension Mother   . Asthma Father   . Alcohol abuse Father   . Sleep disorder Brother   . Bipolar disorder Maternal Grandfather   . Hypertension Maternal Grandmother    Social History:  Social History   Social History  . Marital status: Single    Spouse name: N/A  . Number of children: N/A  . Years of education: N/A   Social History Main Topics  . Smoking status: Current Every Day Smoker    Packs/day: 0.50    Types: Cigarettes    Start date: 10/03/2014  . Smokeless tobacco: Never Used  . Alcohol use 0.0 - 7.2 oz/week  . Drug use:     Types: Marijuana, Cocaine     Comment: marjuana last used yesterday cocaine last used 12-24-15 also uses Xanax  . Sexual activity: Yes    Birth control/ protection: None, Pill   Other Topics Concern  . None   Social History Narrative  . None   Additional History:   Assessment:   Musculoskeletal: Strength & Muscle Tone: within normal limits Gait & Station: normal Patient leans: N/A  Psychiatric Specialty Exam: Anxiety  Patient reports no insomnia or suicidal ideas.    Drug Problem  Pertinent negatives include no hallucinations.  Medication Refill     Review of Systems  Psychiatric/Behavioral:  Negative for depression, hallucinations, memory loss, substance abuse and suicidal ideas. The patient does not have insomnia.        Patient doing well today however per mother there were periods where patient has been irritable and had mood fluctuations.  All other systems reviewed and are negative.   Blood pressure 140/81, pulse 83, temperature 98.4 F (36.9 C), temperature source Oral, height 5\' 3"  (1.6 m), weight 121 lb 9.6 oz (55.2 kg).Body mass index is 21.54 kg/m.  General Appearance: Well Groomed  Eye Contact:  Good  Speech:  Clear and Coherent  Volume:  Normal  Mood: better  Affect:  Smiling   Thought Process:  Linear and Logical  Orientation:  Full (Time, Place, and Person)  Thought Content:   Negative  Suicidal Thoughts:  No  Homicidal Thoughts:  No  Memory:  Immediate;   Good Recent;   Good Remote;   Good  Judgement:  Good  Insight:  Good  Psychomotor Activity:  Negative  Concentration:  Good  Recall:  Good  Fund of Knowledge: Good  Language: Good  Akathisia:  Negative  Handed:    AIMS (if indicated):    Assets:  Communication Skills Desire for Improvement Social Support Vocational/Educational  ADL's:  Intact  Cognition: WNL  Sleep:  good   Is the patient at risk to self?  No. Has the patient been a risk to self in the past 6 months?  No. Has the patient been a risk to self within the distant past?  Yes.   Is the patient a risk to others?  No. Has the patient been a risk to others in the past 6 months?  No. Has the patient been a risk to others within the distant past?  No.  Current Medications: Current Outpatient Prescriptions  Medication Sig Dispense Refill  . QUEtiapine (SEROQUEL) 100 MG tablet Take 1 tablet (100 mg total) by mouth at bedtime. Pt has 200mg  pills- dose is 100mg  at bed time 30 tablet 0  . sertraline (ZOLOFT) 50 MG tablet Take 1 tablet (50 mg total) by mouth daily. 30 tablet 0   Current Facility-Administered Medications  Medication Dose Route Frequency Provider Last Rate Last Dose  . prenatal multivitamin tablet 1 tablet  1 tablet Oral Q1200 Brandy HaleUzma Faheem, MD        Medical Decision Making:  Established Problem, Stable/Improving (1), Review of Medication Regimen & Side Effects (2) and Review of New Medication or Change in Dosage (2)  Treatment Plan Summary:Medication management and Plan   Bipolar disorder, most recent episode depressed- Continue Zoloft 50 mg daily.  Continue Seroquel 100 mg by mouth daily at bedtime. Discussed with patient extensively about how Seroquel is a category C drug and that has been some teratogenicity in animals but little research done in human beings. Patient aware that it could lead to some risk but we are  unaware of what that risk would be. Also discussed that atypical antipsychotics can cause neural tube defects and she reports being on prenatal vitamins including folic acid. Discussed with patient the importance of seeing a therapist to help her with stress reduction and also to prepare her for being a parent and dealing with the stressors and moving on with her life. Patient is agreeable and she will begin to see Nolon RodNicole Peacock for therapy.  Follow-up in 4 weeks or earlier depending on her symptoms   More than 50% of the time spent in psychoeducation, counseling and coordination of care.  This note was generated in part or whole with voice recognition software. Voice regonition is usually quite accurate but there are transcription errors that can and very often do occur. I apologize for any typographical errors that were not detected and corrected.   Khrystian Schauf 09/09/2016, 9:13 AM

## 2016-09-09 NOTE — Telephone Encounter (Signed)
called in rx for seroquel and zoloft

## 2016-09-25 ENCOUNTER — Ambulatory Visit (INDEPENDENT_AMBULATORY_CARE_PROVIDER_SITE_OTHER): Payer: 59 | Admitting: Licensed Clinical Social Worker

## 2016-09-25 DIAGNOSIS — F3132 Bipolar disorder, current episode depressed, moderate: Secondary | ICD-10-CM | POA: Diagnosis not present
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
## 2016-10-09 ENCOUNTER — Ambulatory Visit (INDEPENDENT_AMBULATORY_CARE_PROVIDER_SITE_OTHER): Payer: 59 | Admitting: Psychiatry

## 2016-10-09 DIAGNOSIS — F1394 Sedative, hypnotic or anxiolytic use, unspecified with sedative, hypnotic or anxiolytic-induced mood disorder: Secondary | ICD-10-CM

## 2016-10-09 DIAGNOSIS — F3132 Bipolar disorder, current episode depressed, moderate: Secondary | ICD-10-CM | POA: Diagnosis not present

## 2016-10-09 MED ORDER — SERTRALINE HCL 50 MG PO TABS: 50.0000 mg | ORAL_TABLET | Freq: Every day | ORAL | 0 refills | Status: DC

## 2016-10-09 NOTE — Progress Notes (Signed)
Patient ID: Holly Farrell, female   DOB: [birth year], 20 y.o.   MRN: [id number]  [facility]  10/09/2016 2:36 PM Holly Farrell  MRN:  [id number]  Subjective:  Patient is a 20 year old female with history of bipolar disorder and Xanax abuse in remission. Patient reports that she's been doing okay. She feels like she has some moods up and down but attributes that to her pregnancy. States that she is doing okay overall. She is being seen by Nolon RodNicole Peacock for therapy. She is taking her Seroquel at night. States that she sometimes forgets the Zoloft. States that she sometimes has regrets about how her life turned out to be and is very anxious about the future. She also reports that she is grateful for the pregnancy since it caused her to give up drugs and reevaluate her life. It and said very supportive and will help her in raising the baby. States mood overall is stable     Chief Complaint: Doing well  Visit Diagnosis:     ICD-9-CM ICD-10-CM   1. Bipolar affective disorder, currently depressed, moderate (HCC) 296.52 F31.32   2. 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 Anxiety   . Bipolar disorder (HCC)   . Depression     Past Surgical History:  Procedure Laterality Date  . TONSILLECTOMY     Family History:  Family History  Problem Relation Age of Onset  . Depression Mother   . Anxiety disorder Mother   . Bipolar disorder Mother   . Hypertension Mother   . Asthma Father   . Alcohol abuse Father   . Sleep disorder Brother   . Bipolar disorder Maternal Grandfather   . Hypertension Maternal Grandmother    Social History:  Social History   Social History  . Marital status: Single    Spouse name: N/A  . Number of children: N/A  . Years of education: N/A   Social History Main Topics  . Smoking status: Current Every Day Smoker    Packs/day:  0.50    Types: Cigarettes    Start date: 10/03/2014  . Smokeless tobacco: Never Used  . Alcohol use 0.0 - 7.2 oz/week  . Drug use:     Types: Marijuana, Cocaine     Comment: marjuana last used yesterday cocaine last used 12-24-15 also uses Xanax  . Sexual activity: Yes    Birth control/ protection: None, Pill   Other Topics Concern  . Not on file   Social History Narrative  . No narrative on file   Additional History:   Assessment:   Musculoskeletal: Strength & Muscle Tone: within normal limits Gait & Station: normal Patient leans: N/A  Psychiatric Specialty Exam: Medication Refill   Anxiety  Patient reports no insomnia or suicidal ideas.    Drug Problem  Pertinent negatives include no hallucinations.    Review of Systems  Psychiatric/Behavioral: Negative for depression, hallucinations, memory loss, substance abuse and suicidal ideas. The patient does not have insomnia.        Patient doing well today however per mother there were periods where patient has been irritable and had mood fluctuations.  All other systems reviewed and are negative.   There were no vitals taken for this visit.There is no height or weight on file to calculate BMI.  General Appearance: Well Groomed  Eye Contact:  Good  Speech:  Clear and Coherent  Volume:  Normal  Mood: better  Affect:  Smiling   Thought Process:  Linear and Logical  Orientation:  Full (Time, Place, and Person)  Thought Content:  Negative  Suicidal Thoughts:  No  Homicidal Thoughts:  No  Memory:  Immediate;   Good Recent;   Good Remote;   Good  Judgement:  Good  Insight:  Good  Psychomotor Activity:  Negative  Concentration:  Good  Recall:  Good  Fund of Knowledge: Good  Language: Good  Akathisia:  Negative  Handed:    AIMS (if indicated):    Assets:  Communication Skills Desire for Improvement Social Support Vocational/Educational  ADL's:  Intact  Cognition: WNL  Sleep:  good   Is the patient at risk  to self?  No. Has the patient been a risk to self in the past 6 months?  No. Has the patient been a risk to self within the distant past?  Yes.   Is the patient a risk to others?  No. Has the patient been a risk to others in the past 6 months?  No. Has the patient been a risk to others within the distant past?  No.  Current Medications: Current Outpatient Prescriptions  Medication Sig Dispense Refill  . QUEtiapine (SEROQUEL) 100 MG tablet Take 1 tablet at bedtime. 30 tablet 1  . sertraline (ZOLOFT) 50 MG tablet Take 1 tablet (50 mg total) by mouth daily. 30 tablet 0   Current Facility-Administered Medications  Medication Dose Route Frequency Provider Last Rate Last Dose  . prenatal multivitamin tablet 1 tablet  1 tablet Oral Q1200 Brandy Hale, MD        Medical Decision Making:  Established Problem, Stable/Improving (1), Review of Medication Regimen & Side Effects (2) and Review of New Medication or Change in Dosage (2)  Treatment Plan Summary:Medication management and Plan   Bipolar disorder, most recent episode depressed- Continue Zoloft 50 mg daily.  Continue Seroquel 100 mg by mouth daily at bedtime. Discussed with patient extensively about how Seroquel is a category C drug and that has been some teratogenicity in animals but little research done in human beings. Patient aware that it could lead to some risk but we are unaware of what that risk would be. Also discussed that atypical antipsychotics can cause neural tube defects and she reports being on prenatal vitamins including folic acid.  Follow-up in 4 weeks or earlier depending on her symptoms   More than 50% of the time spent in psychoeducation, counseling and coordination of care.    This note was generated in part or whole with voice recognition software. Voice regonition is usually quite accurate but there are transcription errors that can and very often do occur. I apologize for any typographical errors that were not  detected and corrected.   Donika Butner 10/09/2016, 2:36 PM

## 2016-10-14 ENCOUNTER — Ambulatory Visit: Payer: 59 | Admitting: Licensed Clinical Social Worker

## 2016-10-14 ENCOUNTER — Other Ambulatory Visit: Payer: Self-pay | Admitting: Psychiatry

## 2016-11-04 ENCOUNTER — Ambulatory Visit: Payer: 59 | Admitting: Licensed Clinical Social Worker

## 2016-11-04 ENCOUNTER — Other Ambulatory Visit (HOSPITAL_COMMUNITY): Payer: Self-pay | Admitting: Psychiatry

## 2016-11-11 ENCOUNTER — Telehealth: Payer: Self-pay

## 2016-11-11 ENCOUNTER — Ambulatory Visit: Payer: 59 | Admitting: Psychiatry

## 2016-11-11 MED ORDER — SERTRALINE HCL 50 MG PO TABS: 50.0000 mg | ORAL_TABLET | Freq: Every day | ORAL | 0 refills | Status: DC

## 2016-11-11 MED ORDER — QUETIAPINE FUMARATE 100 MG PO TABS: ORAL_TABLET | ORAL | 0 refills | Status: DC

## 2016-11-11 NOTE — Telephone Encounter (Signed)
Refill Seroquel 50mg  x 10 days- follow up with Dr Daleen Boavi.

## 2016-11-11 NOTE — Telephone Encounter (Signed)
Telephone call with patient's Mother due to patient's phone being full from messages to verify patient is 5 months pregnant and has been on Seroquel and Zoloft from Dr. Daleen Boavi who previously reviewed potential concerns with medications and patient's pregnancy and collateral confirmed this and that patient only takes Zoloft at times but Seroquel daily as discussed this date at night.

## 2016-11-11 NOTE — Telephone Encounter (Signed)
Medication Management - Met with Dr. Gretel Acre to discuss patient's status with medication refills needed and was rescheduled from Dr. Einar Grad this date due to provider out.  A 10 day supply of medications approved and e-scribed these to her requested [facility] on [address] for [employer] and Seroquel.  Patient to return on 11/20/16 for follow up evaluation with Dr. Einar Grad.  Called patient's Mother and informed orders were sent for 10 days as her and patient requested.

## 2016-11-11 NOTE — Telephone Encounter (Addendum)
Medication management - Patient in with her Mother and now Healthcare Power of Attorney who had questions about why Dr. Daleen Boavi may want patient in therapy and patient reported need for refill of Seroquel and is currently 5 months pregnant.  Patient and her Mother report patient is currently sober and staying that way as she is living with her Mother at present.  Patient states her Father who does not live in the same house is doing drug screens with her randomly to make sure patient is staying sober.  Patient reported she is cutting Seroquel pills in half as has been running out and requests refills since Dr. Daleen Boavi is out this date and they were initially scheduled to see Dr. Daleen Boavi this date.  Agreed to request this refill from Dr. Garnetta BuddyFaheem to call into her pharmacy and requested patient reschedule first available.  Patient's Mother stated she was looking forward to meeting Dr. Daleen Boavi and would return with her to appointment.   Collateral to leave a copy of patient's healthcare power of attorney to be scanned into [facility].  Patient stated not taking Zoloft daily as it makes her have bad dreams at times.  Patient to discuss this further with Dr. Daleen Boavi at evaluation now set for 11/20/16.

## 2016-11-12 NOTE — Telephone Encounter (Signed)
Thank you for addressing this.

## 2016-11-17 ENCOUNTER — Other Ambulatory Visit: Payer: Self-pay | Admitting: Psychiatry

## 2016-11-20 ENCOUNTER — Encounter: Payer: Self-pay | Admitting: Psychiatry

## 2016-11-20 ENCOUNTER — Ambulatory Visit (INDEPENDENT_AMBULATORY_CARE_PROVIDER_SITE_OTHER): Payer: 59 | Admitting: Psychiatry

## 2016-11-20 VITALS — BP 126/72 | HR 96 | Temp 97.1°F | Resp 17 | Wt 143.0 lb

## 2016-11-20 DIAGNOSIS — F3132 Bipolar disorder, current episode depressed, moderate: Secondary | ICD-10-CM

## 2016-11-20 MED ORDER — QUETIAPINE FUMARATE 100 MG PO TABS: ORAL_TABLET | ORAL | 2 refills | Status: DC

## 2016-11-20 MED ORDER — SERTRALINE HCL 50 MG PO TABS: 50.0000 mg | ORAL_TABLET | Freq: Every day | ORAL | 2 refills | Status: DC

## 2016-11-20 NOTE — Progress Notes (Signed)
Patient ID: Holly Farrell, female   DOB: [birth year], 20 y.o.   MRN: [id number]  [facility]  11/20/2016 3:47 PM Holly Farrell  MRN:  [id number]  Subjective:  Patient is a 20 year old female with history of bipolar disorder and Xanax abuse in remission. Patient was seen with her mother today for a follow-up. She reports doing quite well. Pregnancy is going along well. She is taking the Zoloft and Seroquel regularly and it's helping her mood. She continues to work. Her mom also reports that she is monitoring Holly Farrell on a regular basis in terms of her drug use and that she's been doing quite well. Denies any mood symptoms or suicidal thoughts.   Chief Complaint: Doing well  Visit Diagnosis:     ICD-9-CM ICD-10-CM   1. Bipolar affective disorder, currently depressed, moderate (HCC) 296.52 F31.32     Past Medical History:  Past Medical History:  Diagnosis Date  . Anxiety   . Bipolar disorder (HCC)   . Depression     Past Surgical History:  Procedure Laterality Date  . TONSILLECTOMY     Family History:  Family History  Problem Relation Age of Onset  . Depression Mother   . Anxiety disorder Mother   . Bipolar disorder Mother   . Hypertension Mother   . Asthma Father   . Alcohol abuse Father   . Sleep disorder Brother   . Bipolar disorder Maternal Grandfather   . Hypertension Maternal Grandmother    Social History:  Social History   Social History  . Marital status: Single    Spouse name: N/A  . Number of children: N/A  . Years of education: N/A   Social History Main Topics  . Smoking status: Current Every Day Smoker    Packs/day: 0.50    Types: Cigarettes    Start date: 10/03/2014  . Smokeless tobacco: Never Used  . Alcohol use 0.0 - 7.2 oz/week  . Drug use:     Types: Marijuana, Cocaine     Comment: marjuana last used yesterday cocaine last used 12-24-15 also uses Xanax  . Sexual activity: Yes    Birth control/ protection: None, Pill    Other Topics Concern  . Not on file   Social History Narrative  . No narrative on file   Additional History:   Assessment:   Musculoskeletal: Strength & Muscle Tone: within normal limits Gait & Station: normal Patient leans: N/A  Psychiatric Specialty Exam: Medication Refill   Anxiety  Patient reports no insomnia or suicidal ideas.    Drug Problem  Pertinent negatives include no hallucinations.    Review of Systems  Psychiatric/Behavioral: Negative for depression, hallucinations, memory loss, substance abuse and suicidal ideas. The patient does not have insomnia.        Patient doing well today however per mother there were periods where patient has been irritable and had mood fluctuations.  All other systems reviewed and are negative.   There were no vitals taken for this visit.There is no height or weight on file to calculate BMI.  General Appearance: Well Groomed  Eye Contact:  Good  Speech:  Clear and Coherent  Volume:  Normal  Mood: Good   Affect:  Smiling   Thought Process:  Linear and Logical  Orientation:  Full (Time, Place, and Person)  Thought Content:  Negative  Suicidal Thoughts:  No  Homicidal Thoughts:  No  Memory:  Immediate;   Good Recent;   Good Remote;  Good  Judgement:  Good  Insight:  Good  Psychomotor Activity:  Negative  Concentration:  Good  Recall:  Good  Fund of Knowledge: Good  Language: Good  Akathisia:  Negative  Handed:    AIMS (if indicated):    Assets:  Communication Skills Desire for Improvement Social Support Vocational/Educational  ADL's:  Intact  Cognition: WNL  Sleep:  good   Is the patient at risk to self?  No. Has the patient been a risk to self in the past 6 months?  No. Has the patient been a risk to self within the distant past?  Yes.   Is the patient a risk to others?  No. Has the patient been a risk to others in the past 6 months?  No. Has the patient been a risk to others within the distant past?   No.  Current Medications: Current Outpatient Prescriptions  Medication Sig Dispense Refill  . QUEtiapine (SEROQUEL) 100 MG tablet Take 1 tablet at bedtime. 10 tablet 0  . sertraline (ZOLOFT) 50 MG tablet Take 1 tablet (50 mg total) by mouth daily. 10 tablet 0   Current Facility-Administered Medications  Medication Dose Route Frequency Provider Last Rate Last Dose  . prenatal multivitamin tablet 1 tablet  1 tablet Oral Q1200 Brandy HaleUzma Faheem, MD        Medical Decision Making:  Established Problem, Stable/Improving (1), Review of Medication Regimen & Side Effects (2) and Review of New Medication or Change in Dosage (2)  Treatment Plan Summary:Medication management and Plan   Bipolar disorder, most recent episode depressed- Continue Zoloft 50 mg daily.  Continue Seroquel 100 mg by mouth daily at bedtime. Discussed with patient And mother  extensively about how Seroquel is a category C drug and that has been some teratogenicity in animals but little research done in human beings. Patient aware that it could lead to some risk but we are unaware of what that risk would be. Also discussed that atypical antipsychotics can cause neural tube defects and she reports being on prenatal vitamins including folic acid.  Follow-up in 2 months or earlier depending on her symptoms   More than 50% of the time spent in psychoeducation, counseling and coordination of care.    This note was generated in part or whole with voice recognition software. Voice regonition is usually quite accurate but there are transcription errors that can and very often do occur. I apologize for any typographical errors that were not detected and corrected.   Hilery Wintle 11/20/2016, 3:47 PM

## 2016-12-02 DIAGNOSIS — F1290 Cannabis use, unspecified, uncomplicated: Secondary | ICD-10-CM | POA: Insufficient documentation

## 2016-12-02 DIAGNOSIS — A749 Chlamydial infection, unspecified: Secondary | ICD-10-CM | POA: Insufficient documentation

## 2016-12-02 DIAGNOSIS — O98811 Other maternal infectious and parasitic diseases complicating pregnancy, first trimester: Secondary | ICD-10-CM

## 2017-01-20 ENCOUNTER — Ambulatory Visit: Payer: 59 | Admitting: Psychiatry

## 2017-01-28 ENCOUNTER — Ambulatory Visit (INDEPENDENT_AMBULATORY_CARE_PROVIDER_SITE_OTHER): Payer: 59 | Admitting: Psychiatry

## 2017-01-28 ENCOUNTER — Encounter: Payer: Self-pay | Admitting: Psychiatry

## 2017-01-28 VITALS — BP 125/63 | HR 106 | Temp 97.2°F | Wt 157.0 lb

## 2017-01-28 DIAGNOSIS — K219 Gastro-esophageal reflux disease without esophagitis: Secondary | ICD-10-CM

## 2017-01-28 DIAGNOSIS — L709 Acne, unspecified: Secondary | ICD-10-CM | POA: Insufficient documentation

## 2017-01-28 DIAGNOSIS — F1394 Sedative, hypnotic or anxiolytic use, unspecified with sedative, hypnotic or anxiolytic-induced mood disorder: Secondary | ICD-10-CM | POA: Diagnosis not present

## 2017-01-28 DIAGNOSIS — F419 Anxiety disorder, unspecified: Secondary | ICD-10-CM | POA: Insufficient documentation

## 2017-01-28 DIAGNOSIS — F3170 Bipolar disorder, currently in remission, most recent episode unspecified: Secondary | ICD-10-CM | POA: Diagnosis not present

## 2017-01-28 DIAGNOSIS — L309 Dermatitis, unspecified: Secondary | ICD-10-CM | POA: Insufficient documentation

## 2017-01-28 HISTORY — DX: Gastro-esophageal reflux disease without esophagitis: K21.9

## 2017-01-28 HISTORY — DX: Acne, unspecified: L70.9

## 2017-01-28 MED ORDER — QUETIAPINE FUMARATE 100 MG PO TABS: ORAL_TABLET | ORAL | 2 refills | Status: DC

## 2017-01-28 MED ORDER — SERTRALINE HCL 50 MG PO TABS: 50.0000 mg | ORAL_TABLET | Freq: Every day | ORAL | 2 refills | Status: DC

## 2017-01-28 NOTE — Progress Notes (Signed)
Patient ID: Holly Farrell, female   DOB: 05-06-1996, 21 y.o.   MRN: [id number]  [facility]  01/28/2017 9:48 AM Holly Farrell  MRN:  [id number]  Subjective:  Patient is a 21 year old female with history of bipolar disorder and Xanax abuse in remission. Patient was seen today for a follow-up of her anxiety and depression. Patient reports that she has been doing quite well. States that her mood is stable but she had been feeling anxious. She attributes her anxiety to her work conditions at [employer]. States that she is decreasing her outbursts and has plans to look for a new job once the baby is born. Patient is quite excited about her baby and due date is early April. States that her mom has been very supportive and had the baby's room very along with the trip. Patient isn't rolling in classes in August and has a plan for a job. Overall doing quite well. Denies use of any substances. Fair sleep and appetite.  Chief Complaint: Doing well Chief Complaint    Follow-up; Medication Refill     Visit Diagnosis:     ICD-9-CM ICD-10-CM   1. Bipolar disorder in partial remission, most recent episode unspecified type (HCC) 296.80 F31.70   2. 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 Anxiety   . Bipolar disorder (HCC)   . Depression     Past Surgical History:  Procedure Laterality Date  . TONSILLECTOMY     Family History:  Family History  Problem Relation Age of Onset  . Depression Mother   . Anxiety disorder Mother   . Bipolar disorder Mother   . Hypertension Mother   . Asthma Father   . Alcohol abuse Father   . Sleep disorder Brother   . Bipolar disorder Maternal Grandfather   . Hypertension Maternal Grandmother    Social History:  Social History   Social History  . Marital status: Single    Spouse name: N/A  . Number of children: N/A  .  Years of education: N/A   Social History Main Topics  . Smoking status: Current Every Day Smoker    Packs/day: 0.50    Types: Cigarettes    Start date: 10/03/2014  . Smokeless tobacco: Never Used  . Alcohol use 0.0 - 7.2 oz/week  . Drug use: Yes    Types: Marijuana, Cocaine     Comment: marjuana last used yesterday cocaine last used 12-24-15 also uses Xanax  . Sexual activity: Yes    Birth control/ protection: None, Pill   Other Topics Concern  . None   Social History Narrative  . None   Additional History:   Assessment:   Musculoskeletal: Strength & Muscle Tone: within normal limits Gait & Station: normal Patient leans: N/A  Psychiatric Specialty Exam: Medication Refill   Anxiety  Patient reports no insomnia, nervous/anxious behavior or suicidal ideas.    Drug Problem  Pertinent negatives include no hallucinations.    Review of Systems  Psychiatric/Behavioral: Negative for depression, hallucinations, memory loss, substance abuse and suicidal ideas. The patient is not nervous/anxious and does not have insomnia.        Patient doing well today however per mother there were periods where patient has been irritable and had mood fluctuations.  All other systems reviewed and are negative.   Blood pressure 125/63, pulse (!) 106,  temperature 97.2 F (36.2 C), temperature source Oral, weight 157 lb (71.2 kg), last menstrual period 06/12/2016.Body mass index is 27.81 kg/m.  General Appearance: Well Groomed  Eye Contact:  Good  Speech:  Clear and Coherent  Volume:  Normal  Mood: Good   Affect:  Smiling   Thought Process:  Linear and Logical  Orientation:  Full (Time, Place, and Person)  Thought Content:  Negative  Suicidal Thoughts:  No  Homicidal Thoughts:  No  Memory:  Immediate;   Good Recent;   Good Remote;   Good  Judgement:  Good  Insight:  Good  Psychomotor Activity:  Negative  Concentration:  Good  Recall:  Good  Fund of Knowledge: Good  Language:  Good  Akathisia:  Negative  Handed:    AIMS (if indicated):    Assets:  Communication Skills Desire for Improvement Social Support Vocational/Educational  ADL's:  Intact  Cognition: WNL  Sleep:  good   Is the patient at risk to self?  No. Has the patient been a risk to self in the past 6 months?  No. Has the patient been a risk to self within the distant past?  Yes.   Is the patient a risk to others?  No. Has the patient been a risk to others in the past 6 months?  No. Has the patient been a risk to others within the distant past?  No.  Current Medications: Current Outpatient Prescriptions  Medication Sig Dispense Refill  . Prenatal Vit-Fe Fumarate-FA (PNV PRENATAL PLUS MULTIVITAMIN) 27-1 MG TABS Take 1 tablet by mouth daily.  7  . sertraline (ZOLOFT) 50 MG tablet Take 1 tablet (50 mg total) by mouth daily. 30 tablet 2   Current Facility-Administered Medications  Medication Dose Route Frequency Provider Last Rate Last Dose  . prenatal multivitamin tablet 1 tablet  1 tablet Oral Q1200 Brandy Hale, MD        Medical Decision Making:  Established Problem, Stable/Improving (1), Review of Medication Regimen & Side Effects (2) and Review of New Medication or Change in Dosage (2)  Treatment Plan Summary:Medication management and Plan   Bipolar disorder, in partial remission Continue Zoloft 50 mg daily.  Continue Seroquel 100 mg by mouth daily at bedtime.   Discussed with patient And mother  extensively about how Seroquel is a category C drug and that has been some teratogenicity in animals but little research done in human beings. Patient aware that it could lead to some risk but we are unaware of what that risk would be. Also discussed that atypical antipsychotics can cause neural tube defects and she reports being on prenatal vitamins including folic acid.  Follow-up in 3 months or earlier depending on her symptoms   More than 50% of the time spent in psychoeducation, counseling  and coordination of care. Discussed with patient importance of good nutrition and getting plenty of rest. Encouraged her to quit her job and just rest for the next couple of months. Agreeable to this plan.   This note was generated in part or whole with voice recognition software. Voice regonition is usually quite accurate but there are transcription errors that can and very often do occur. I apologize for any typographical errors that were not detected and corrected.   Holly Farrell 01/28/2017, 9:48 AM

## 2017-03-23 HISTORY — PX: OTHER SURGICAL HISTORY: SHX169

## 2017-04-28 ENCOUNTER — Ambulatory Visit: Payer: Self-pay | Admitting: Psychiatry

## 2017-06-03 ENCOUNTER — Other Ambulatory Visit: Payer: Self-pay

## 2017-06-03 MED ORDER — QUETIAPINE FUMARATE 100 MG PO TABS: ORAL_TABLET | ORAL | 0 refills | Status: DC

## 2017-06-03 MED ORDER — SERTRALINE HCL 50 MG PO TABS: 50.0000 mg | ORAL_TABLET | Freq: Every day | ORAL | 0 refills | Status: DC

## 2017-06-03 NOTE — Telephone Encounter (Signed)
Please give 1 month supply for both

## 2017-06-03 NOTE — Telephone Encounter (Signed)
pt called left message that she needs enough medication to get to next appt. pt was last seen on  01-28-17 next appt  06-11-17. Pt needs zoloft and seroquel

## 2017-06-11 ENCOUNTER — Other Ambulatory Visit (HOSPITAL_COMMUNITY): Payer: Self-pay | Admitting: Psychiatry

## 2017-06-11 ENCOUNTER — Ambulatory Visit (INDEPENDENT_AMBULATORY_CARE_PROVIDER_SITE_OTHER): Payer: 59 | Admitting: Psychiatry

## 2017-06-11 DIAGNOSIS — F3170 Bipolar disorder, currently in remission, most recent episode unspecified: Secondary | ICD-10-CM | POA: Diagnosis not present

## 2017-06-11 MED ORDER — SERTRALINE HCL 100 MG PO TABS: 100.0000 mg | ORAL_TABLET | Freq: Every day | ORAL | 1 refills | Status: DC

## 2017-06-11 MED ORDER — QUETIAPINE FUMARATE 100 MG PO TABS: ORAL_TABLET | ORAL | 1 refills | Status: DC

## 2017-06-11 NOTE — Progress Notes (Signed)
Patient ID: Holly Farrell, female   DOB: 1996/07/06, 21 y.o.   MRN: [id number]  [facility]  06/11/2017 3:59 PM Holly Farrell  MRN:  [id number]  Subjective:  Patient is a 21 year old female with history of bipolar disorder and Xanax abuse in remission. Patient was seen today for a follow-up.She had her baby and is doing well. States her labor was rough but she is doing well now.Fair sleep and appetite. She reports increased anxiety after her delivery. She is too emotional over some things. Denies any suicidal thoughts.  Chief Complaint: Doing well  Visit Diagnosis:     ICD-10-CM   1. Bipolar disorder in partial remission, most recent episode unspecified type (HCC) F31.70     Past Medical History:  Past Medical History:  Diagnosis Date  . Anxiety   . Bipolar disorder (HCC)   . Depression     Past Surgical History:  Procedure Laterality Date  . TONSILLECTOMY     Family History:  Family History  Problem Relation Age of Onset  . Depression Mother   . Anxiety disorder Mother   . Bipolar disorder Mother   . Hypertension Mother   . Asthma Father   . Alcohol abuse Father   . Sleep disorder Brother   . Bipolar disorder Maternal Grandfather   . Hypertension Maternal Grandmother    Social History:  Social History   Social History  . Marital status: Single    Spouse name: N/A  . Number of children: N/A  . Years of education: N/A   Social History Main Topics  . Smoking status: Current Every Day Smoker    Packs/day: 0.50    Types: Cigarettes    Start date: 10/03/2014  . Smokeless tobacco: Never Used  . Alcohol use 0.0 - 7.2 oz/week  . Drug use: Yes    Types: Marijuana, Cocaine     Comment: marjuana last used yesterday cocaine last used 12-24-15 also uses Xanax  . Sexual activity: Yes    Birth control/ protection: None, Pill   Other Topics Concern  . Not on file   Social History Narrative  . No narrative on file   Additional History:    Assessment:   Musculoskeletal: Strength & Muscle Tone: within normal limits Gait & Station: normal Patient leans: N/A  Psychiatric Specialty Exam: Medication Refill   Anxiety  Patient reports no insomnia, nervous/anxious behavior or suicidal ideas.    Drug Problem  Pertinent negatives include no hallucinations.    Review of Systems  Psychiatric/Behavioral: Negative for depression, hallucinations, memory loss, substance abuse and suicidal ideas. The patient is not nervous/anxious and does not have insomnia.        Patient doing well today however per mother there were periods where patient has been irritable and had mood fluctuations.  All other systems reviewed and are negative.   Last menstrual period 06/12/2016.There is no height or weight on file to calculate BMI.  General Appearance: Well Groomed  Eye Contact:  Good  Speech:  Clear and Coherent  Volume:  Normal  Mood: Good   Affect:  Smiling   Thought Process:  Linear and Logical  Orientation:  Full (Time, Place, and Person)  Thought Content:  Negative  Suicidal Thoughts:  No  Homicidal Thoughts:  No  Memory:  Immediate;   Good Recent;   Good Remote;   Good  Judgement:  Good  Insight:  Good  Psychomotor Activity:  Negative  Concentration:  Good  Recall:  Good  Fund of Knowledge: Good  Language: Good  Akathisia:  Negative  Handed:    AIMS (if indicated):    Assets:  Communication Skills Desire for Improvement Social Support Vocational/Educational  ADL's:  Intact  Cognition: WNL  Sleep:  good   Is the patient at risk to self?  No. Has the patient been a risk to self in the past 6 months?  No. Has the patient been a risk to self within the distant past?  Yes.   Is the patient a risk to others?  No. Has the patient been a risk to others in the past 6 months?  No. Has the patient been a risk to others within the distant past?  No.  Current Medications: Current Outpatient Prescriptions  Medication Sig  Dispense Refill  . Prenatal Vit-Fe Fumarate-FA (PNV PRENATAL PLUS MULTIVITAMIN) 27-1 MG TABS Take 1 tablet by mouth daily.  7  . QUEtiapine (SEROQUEL) 100 MG tablet Take 1 tablet at bedtime. 9 tablet 0  . sertraline (ZOLOFT) 50 MG tablet Take 1 tablet (50 mg total) by mouth daily. 9 tablet 0   Current Facility-Administered Medications  Medication Dose Route Frequency Provider Last Rate Last Dose  . prenatal multivitamin tablet 1 tablet  1 tablet Oral Q1200 Brandy Hale, MD        Medical Decision Making:  Established Problem, Stable/Improving (1), Review of Medication Regimen & Side Effects (2) and Review of New Medication or Change in Dosage (2)  Treatment Plan Summary:Medication management and Plan   Bipolar disorder, in partial remission Continue Zoloft 50 mg daily.  Continue Seroquel 100 mg by mouth daily at bedtime.   Follow-up in 3 months or earlier depending on her symptoms   More than 50% of the time spent in psychoeducation, counseling and coordination of care. Discussed with patient importance of good nutrition and getting plenty of rest. Encouraged her to quit her job and just rest for the next couple of months. Agreeable to this plan.   This note was generated in part or whole with voice recognition software. Voice regonition is usually quite accurate but there are transcription errors that can and very often do occur. I apologize for any typographical errors that were not detected and corrected.   Dante Cooter 06/11/2017, 3:59 PM

## 2017-06-12 NOTE — Telephone Encounter (Signed)
sertraline (ZOLOFT) 100 MG tablet  Medication  Date: 06/11/2017 Department: [facility] Ordering/Authorizing: Patrick Northavi, Himabindu, MD  Order Providers   Prescribing Provider Encounter Provider  Patrick Northavi, Himabindu, MD Patrick Northavi, Himabindu, MD  Medication Detail    Disp Refills Start End   sertraline (ZOLOFT) 100 MG tablet 30 tablet 1 06/11/2017    Sig - Route: Take 1 tablet (100 mg total) by mouth daily. - Oral   E-Prescribing Status: Receipt confirmed by pharmacy (06/11/2017 4:02 PM EDT)    QUEtiapine (SEROQUEL) 100 MG tablet  Medication  Date: 06/11/2017 Department[facility] Ordering/Authorizing: Patrick Northavi, Himabindu, MD  Order Providers   Prescribing Provider Encounter Provider  Patrick Northavi, Himabindu, MD Patrick Northavi, Himabindu, MD  Medication Detail    Disp Refills Start End   QUEtiapine (SEROQUEL) 100 MG tablet 30 tablet 1 06/11/2017    Sig: Take 1 tablet at bedtime.   E-Prescribing Status: Receipt confirmed by pharmacy (06/11/2017 4:02 PM EDT)     Dr Daleen Boavi sent in on  06-11-17

## 2017-06-12 NOTE — Telephone Encounter (Signed)
This was done by dr. Daleen Boravi on 06-11-17 pt was given enough medication to due until appt.

## 2017-07-08 ENCOUNTER — Ambulatory Visit: Payer: 59 | Admitting: Psychiatry

## 2018-01-16 ENCOUNTER — Ambulatory Visit (INDEPENDENT_AMBULATORY_CARE_PROVIDER_SITE_OTHER): Payer: 59 | Admitting: Obstetrics and Gynecology

## 2018-01-16 ENCOUNTER — Encounter: Payer: Self-pay | Admitting: Obstetrics and Gynecology

## 2018-01-16 VITALS — BP 102/58 | Wt 153.0 lb

## 2018-01-16 DIAGNOSIS — Z3009 Encounter for other general counseling and advice on contraception: Secondary | ICD-10-CM | POA: Diagnosis not present

## 2018-01-16 DIAGNOSIS — K649 Unspecified hemorrhoids: Secondary | ICD-10-CM

## 2018-01-16 DIAGNOSIS — Z8759 Personal history of other complications of pregnancy, childbirth and the puerperium: Secondary | ICD-10-CM

## 2018-01-16 HISTORY — DX: Unspecified hemorrhoids: K64.9

## 2018-01-16 MED ORDER — HYDROCORTISONE ACETATE 25 MG RE SUPP: 25.0000 mg | Freq: Two times a day (BID) | RECTAL | 0 refills | Status: AC

## 2018-01-16 NOTE — Progress Notes (Signed)
Obstetrics & Gynecology Office Visit   Chief Complaint  Patient presents with  . Hemorrhoids  . Vaginal Pain    Second opinion on granulation tissue after delivery   History of Present Illness: 22 y.o. 701P1001 female who has a history of forceps-assisted vaginal delivery nine months ago, which was complicated by a third-degree laceration.  She has had considerable difficulty in healing. She had granulation tissue along her vagina, which has been [facility], with documented resolution of her granulation tissue.  She presents today for a second opinion regarding her healing process.  She continues to have some amount pain to touch and there are areas that are still somewhat sensitive.  She is also worried that her external vaginal area does not have a normal appearance. She states that she has not attempted intercourse since the birth of her daughter.  With her third degree laceration she notes occasional incontinence of flatus, but no liquid or solid stool incontinence.    She also has what she calls hemorrhoids.  She states she can not see or feel them. However there is bleeding with her bowel movements, with associated itching and burning.  She has tried multiple over the counter treatments, including topical witch hazel, over-the-counter suppositories, sitz baths.  This has not helped. Nothing seems to make the irritation worse or better.  Apart from what has been mentioned, there are no associated symptoms.  Her symptoms have been particularly bad for the past two weeks and come and go.    She further asks about contraception.  She is currently on nothing for contraception and is unsure of what she would like to start and when.  She has a past medical history significant for no contraindication to estrogen.  She specifically denies a history of migraine with aura, chronic hypertension, history of DVT/PE and smoking.  Reported Patient's last menstrual period was 12/23/2017  (approximate). She is still breastfeeding her daughter a few times per day.  She is not in a relationship. However, she would like to consider something for contraception and regulation of her menses.  She notably has a history of psychiatric issues and substance use (marijuana, cocaine, etc). However, she states that she has used no drugs since learning of her pregnancy with her daughter.  She has a history of chlamydia at age 22 and at the beginning of her pregnancy, treated both times.     Past Medical History:  Diagnosis Date  . Anxiety   . Bipolar disorder (HCC)   . Depression     Past Surgical History:  Procedure Laterality Date  . 3rd degree laceration  03/2017  . TONSILLECTOMY      Gynecologic History: Patient's last menstrual period was 12/23/2017 (approximate).  Obstetric History: G1P1001, s/p forceps assisted vaginal delivery complicated by a third degree laceration.  Family History  Problem Relation Age of Onset  . Depression Mother   . Anxiety disorder Mother   . Bipolar disorder Mother   . Hypertension Mother   . Asthma Father   . Alcohol abuse Father   . Sleep disorder Brother   . Hypertension Maternal Grandmother   . Bipolar disorder Maternal Grandfather     Social History   Socioeconomic History  . Marital status: Single    Spouse name: Not on file  . Number of children: Not on file  . Years of education: Not on file  . Highest education level: Not on file  Social Needs  . Physicist, medicalinancial resource  strain: Not on file  . Food insecurity - worry: Not on file  . Food insecurity - inability: Not on file  . Transportation needs - medical: Not on file  . Transportation needs - non-medical: Not on file  Occupational History  . Not on file  Tobacco Use  . Smoking status: Current Every Day Smoker    Packs/day: 0.50    Types: Cigarettes    Start date: 10/03/2014  . Smokeless tobacco: Never Used  Substance and Sexual Activity  . Alcohol use: Yes     Alcohol/week: 0.0 - 7.2 oz  . Drug use: Yes    Types: Marijuana, Cocaine    Comment: marjuana last used yesterday cocaine last used 12-24-15 also uses Xanax  . Sexual activity: Yes    Birth control/protection: None, Pill  Other Topics Concern  . Not on file  Social History Narrative  . Not on file    Allergies  Allergen Reactions  . Codeine     Patient stats she might have had an allergic reaction at 22 y/o, but then states she took it when older with a mild issue (nausea and vomiting)  . Lamictal [Lamotrigine] Rash    Medications  None currently   Review of Systems  Constitutional: Negative.   HENT: Negative.   Eyes: Negative.   Respiratory: Negative.   Cardiovascular: Negative.   Gastrointestinal: Positive for blood in stool. Negative for abdominal pain, constipation, diarrhea and melena.  Genitourinary: Negative.   Musculoskeletal: Negative.   Skin: Negative.   Neurological: Negative.   Psychiatric/Behavioral: Negative.      Physical Exam BP (!) 102/58 (BP Location: Left Arm, Patient Position: Sitting, Cuff Size: Normal)   Wt 153 lb (69.4 kg)   LMP 12/23/2017 (Approximate)   Breastfeeding? Yes   BMI 27.10 kg/m  Patient's last menstrual period was 12/23/2017 (approximate). Physical Exam  Constitutional: She is oriented to person, place, and time. She appears well-developed and well-nourished. No distress.  Genitourinary: Pelvic exam was performed with patient supine.  There is tenderness (along right posterior labium majus, no lesions noted) and injury on the right labia. There is no rash or lesion on the right labia.  There is injury on the left labia. There is no rash, tenderness or lesion on the left labia.    Vagina exhibits no lesion. No erythema, tenderness or bleeding in the vagina. There are signs of injury (well-healed, hymenal ring difficult to discern posteriorly, posterior vaginal wall somewhat prominentliy seen externally though not prolapsing) in the  vagina. No vaginal discharge found. Right adnexum does not display mass, does not display tenderness and does not display fullness. Left adnexum does not display mass, does not display tenderness and does not display fullness. Cervix does not exhibit motion tenderness, lesion, discharge or polyp.   Uterus is mobile and anteverted. Uterus is not enlarged, tender, exhibiting a mass or irregular (is regular). Rectal exam shows no external hemorrhoid, no fissure, no mass and no tenderness.  HENT:  Head: Normocephalic and atraumatic.  Eyes: EOM are normal. No scleral icterus.  Neck: Normal range of motion. Neck supple. No thyromegaly present.  Cardiovascular: Normal rate and regular rhythm. Exam reveals no gallop and no friction rub.  No murmur heard. Pulmonary/Chest: Effort normal and breath sounds normal. No respiratory distress. She has no wheezes. She has no rales.  Abdominal: Soft. Bowel sounds are normal. She exhibits no distension and no mass. There is no tenderness. There is no rebound and no guarding.  Musculoskeletal: Normal  range of motion. She exhibits no edema.  Lymphadenopathy:    She has no cervical adenopathy.  Neurological: She is alert and oriented to person, place, and time. No cranial nerve deficit.  Skin: Skin is warm and dry. No erythema.  Psychiatric: She has a normal mood and affect. Her behavior is normal. Judgment normal.   Female chaperone present for pelvic and breast  portions of the physical exam  Assessment: 22 y.o. G89P1001 female here for  1. Encounter for other general counseling or advice on contraception   2. Hemorrhoids, unspecified hemorrhoid type   3. History of third degree perineal laceration      Plan: Problem List Items Addressed This Visit      Cardiovascular and Mediastinum   Hemorrhoid   Relevant Medications   hydrocortisone (ANUSOL-HC) 25 MG suppository     Other   History of third degree perineal laceration    Other Visit Diagnoses     Encounter for other general counseling or advice on contraception    -  Primary     1) history of third-degree laceration:  I reassured the patient that while she may not have the external appearance she had prior to her pregnancy and delivery, she has an appearance that is a variant of normal given her birth trauma history.  We discussed the options available to her for reconstruction, if desired.  She could see a urogynecologist for consultation. However, The improvement in appearance may only be slight and she would have another recovery to endure.  I certainly would not consider this surgery prior to completion of all child birth.  Her tenderness on exam is likely due to the healing process and/or some nerve entrapment associated with the repair. She should expect this to continue to improve over time. We briefly discussed method of delivery in future pregnancies. Given her difficult recover and trauma with her first delivery, she should be offered primary cesarean delivery as a reasonable option. However, we did discuss the low recurrence rate (< 10%) of third and fourth degree lacerations in future deliveries. So, consideration for a vaginal delivery should be given.  2) hemorroids: it is possible that her discomfort is related to her trauma and repair, as I see no obvious hemorrhoids or anal fissure. I will start her with a two-week course of topical steroid suppositories.  If this does not improve her symptoms, will refer her to GI for further evaluation.    3) contraceptive management: Reviewed all forms of birth control options available including abstinence; over the counter/barrier methods; hormonal contraceptive medication including pill, patch, ring, injection,contraceptive implant; hormonal and nonhormonal IUDs; permanent sterilization options including vasectomy and the various tubal sterilization modalities. Risks and benefits reviewed.  Questions were answered.  Information was given to  patient to review.  She will consider for now.  Discussed LARCs.  Once she is sexually active again, would encourage routine STD screening for her.  Follow up once she has completed breastfeeding for routine annual gynecologic care.  Thomasene Mohair, MD 01/17/2018 5:12 PM

## 2018-01-17 ENCOUNTER — Encounter: Payer: Self-pay | Admitting: Obstetrics and Gynecology

## 2018-01-17 DIAGNOSIS — Z8759 Personal history of other complications of pregnancy, childbirth and the puerperium: Secondary | ICD-10-CM | POA: Insufficient documentation

## 2018-01-21 ENCOUNTER — Encounter: Payer: Self-pay | Admitting: Obstetrics and Gynecology

## 2018-02-23 ENCOUNTER — Ambulatory Visit: Payer: 59 | Admitting: Obstetrics and Gynecology

## 2018-02-27 ENCOUNTER — Ambulatory Visit: Payer: 59 | Admitting: Obstetrics and Gynecology

## 2018-02-27 ENCOUNTER — Ambulatory Visit (INDEPENDENT_AMBULATORY_CARE_PROVIDER_SITE_OTHER): Payer: 59 | Admitting: Obstetrics and Gynecology

## 2018-02-27 ENCOUNTER — Encounter: Payer: Self-pay | Admitting: Obstetrics and Gynecology

## 2018-02-27 VITALS — BP 124/74 | Ht 64.0 in | Wt 148.0 lb

## 2018-02-27 DIAGNOSIS — Z30430 Encounter for insertion of intrauterine contraceptive device: Secondary | ICD-10-CM | POA: Diagnosis not present

## 2018-02-27 DIAGNOSIS — Z113 Encounter for screening for infections with a predominantly sexual mode of transmission: Secondary | ICD-10-CM

## 2018-02-27 NOTE — Progress Notes (Signed)
   IUD Insertion Procedure Note Patient identified, informed consent performed, consent signed.   Discussed risks of irregular bleeding, cramping, infection, malpositioning, expulsion or uterine perforation of the IUD (1:1000 placements)  which may require further procedure such as laparoscopy.  IUD while effective at preventing pregnancy do not prevent transmission of sexually transmitted diseases and use of barrier methods for this purpose was discussed. Time out was performed.  Urine pregnancy test negative.  Speculum placed in the vagina.  Cervix visualized.  Cleaned with Betadine x 2.  Grasped anteriorly with a single tooth tenaculum.  Uterus sounded to 9 cm. IUD placed per manufacturer's recommendations.  Strings trimmed to 3 cm. Tenaculum was removed, good hemostasis noted.  Patient tolerated procedure well.   Patient was given post-procedure instructions.  She was advised to have backup contraception for one week.  Patient was also asked to check IUD strings periodically and follow up in 4 weeks for IUD check.   Thomasene MohairStephen Mildreth Reek, MD, Merlinda FrederickFACOG [facility], [facility] 02/27/2018 11:45 AM

## 2018-03-01 LAB — GC/CHLAMYDIA PROBE AMP
Chlamydia trachomatis, NAA: NEGATIVE
NEISSERIA GONORRHOEAE BY PCR: NEGATIVE

## 2018-03-10 ENCOUNTER — Ambulatory Visit (INDEPENDENT_AMBULATORY_CARE_PROVIDER_SITE_OTHER): Payer: 59 | Admitting: Psychiatry

## 2018-03-10 ENCOUNTER — Encounter: Payer: Self-pay | Admitting: Psychiatry

## 2018-03-10 ENCOUNTER — Other Ambulatory Visit: Payer: Self-pay

## 2018-03-10 VITALS — BP 119/80 | HR 99 | Temp 98.4°F | Wt 146.4 lb

## 2018-03-10 DIAGNOSIS — F1221 Cannabis dependence, in remission: Secondary | ICD-10-CM | POA: Diagnosis not present

## 2018-03-10 DIAGNOSIS — F1421 Cocaine dependence, in remission: Secondary | ICD-10-CM

## 2018-03-10 DIAGNOSIS — F1121 Opioid dependence, in remission: Secondary | ICD-10-CM

## 2018-03-10 DIAGNOSIS — F1994 Other psychoactive substance use, unspecified with psychoactive substance-induced mood disorder: Secondary | ICD-10-CM

## 2018-03-10 DIAGNOSIS — F1311 Sedative, hypnotic or anxiolytic abuse, in remission: Secondary | ICD-10-CM | POA: Diagnosis not present

## 2018-03-10 DIAGNOSIS — F17200 Nicotine dependence, unspecified, uncomplicated: Secondary | ICD-10-CM | POA: Diagnosis not present

## 2018-03-10 DIAGNOSIS — F411 Generalized anxiety disorder: Secondary | ICD-10-CM | POA: Diagnosis not present

## 2018-03-10 MED ORDER — SERTRALINE HCL 50 MG PO TABS
50.0000 mg | ORAL_TABLET | Freq: Every day | ORAL | 1 refills | Status: DC
Start: 2018-03-10 — End: 2018-04-08

## 2018-03-10 MED ORDER — QUETIAPINE FUMARATE 25 MG PO TABS: 12.5000 mg | ORAL_TABLET | Freq: Every day | ORAL | 1 refills | Status: DC
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
## 2018-03-10 NOTE — Patient Instructions (Signed)
Sertraline tablets What is this medicine? SERTRALINE (SER tra leen) is used to treat depression. It may also be used to treat obsessive compulsive disorder, panic disorder, post-trauma stress, premenstrual dysphoric disorder (PMDD) or social anxiety. This medicine may be used for other purposes; ask your health care provider or pharmacist if you have questions. COMMON BRAND NAME(S): Zoloft What should I tell my health care provider before I take this medicine? They need to know if you have any of these conditions: -bleeding disorders -bipolar disorder or a family history of bipolar disorder -glaucoma -heart disease -high blood pressure -history of irregular heartbeat -history of low levels of calcium, magnesium, or potassium in the blood -if you often drink alcohol -liver disease -receiving electroconvulsive therapy -seizures -suicidal thoughts, plans, or attempt; a previous suicide attempt by you or a family member -take medicines that treat or prevent blood clots -thyroid disease -an unusual or allergic reaction to sertraline, other medicines, foods, dyes, or preservatives -pregnant or trying to get pregnant -breast-feeding How should I use this medicine? Take this medicine by mouth with a glass of water. Follow the directions on the prescription label. You can take it with or without food. Take your medicine at regular intervals. Do not take your medicine more often than directed. Do not stop taking this medicine suddenly except upon the advice of your doctor. Stopping this medicine too quickly may cause serious side effects or your condition may worsen. A special MedGuide will be given to you by the pharmacist with each prescription and refill. Be sure to read this information carefully each time. Talk to your pediatrician regarding the use of this medicine in children. While this drug may be prescribed for children as young as 7 years for selected conditions, precautions do  apply. Overdosage: If you think you have taken too much of this medicine contact a poison control center or emergency room at once. NOTE: This medicine is only for you. Do not share this medicine with others. What if I miss a dose? If you miss a dose, take it as soon as you can. If it is almost time for your next dose, take only that dose. Do not take double or extra doses. What may interact with this medicine? Do not take this medicine with any of the following medications: -cisapride -dofetilide -dronedarone -linezolid -MAOIs like Carbex, Eldepryl, Marplan, Nardil, and Parnate -methylene blue (injected into a vein) -pimozide -thioridazine This medicine may also interact with the following medications: -alcohol -amphetamines -aspirin and aspirin-like medicines -certain medicines for depression, anxiety, or psychotic disturbances -certain medicines for fungal infections like ketoconazole, fluconazole, posaconazole, and itraconazole -certain medicines for irregular heart beat like flecainide, quinidine, propafenone -certain medicines for migraine headaches like almotriptan, eletriptan, frovatriptan, naratriptan, rizatriptan, sumatriptan, zolmitriptan -certain medicines for sleep -certain medicines for seizures like carbamazepine, valproic acid, phenytoin -certain medicines that treat or prevent blood clots like warfarin, enoxaparin, dalteparin -cimetidine -digoxin -diuretics -fentanyl -isoniazid -lithium -NSAIDs, medicines for pain and inflammation, like ibuprofen or naproxen -other medicines that prolong the QT interval (cause an abnormal heart rhythm) -rasagiline -safinamide -supplements like St. John's wort, kava kava, valerian -tolbutamide -tramadol -tryptophan This list may not describe all possible interactions. Give your health care provider a list of all the medicines, herbs, non-prescription drugs, or dietary supplements you use. Also tell them if you smoke, drink  alcohol, or use illegal drugs. Some items may interact with your medicine. What should I watch for while using this medicine? Tell your doctor if your symptoms   do not get better or if they get worse. Visit your doctor or health care professional for regular checks on your progress. Because it may take several weeks to see the full effects of this medicine, it is important to continue your treatment as prescribed by your doctor. Patients and their families should watch out for new or worsening thoughts of suicide or depression. Also watch out for sudden changes in feelings such as feeling anxious, agitated, panicky, irritable, hostile, aggressive, impulsive, severely restless, overly excited and hyperactive, or not being able to sleep. If this happens, especially at the beginning of treatment or after a change in dose, call your health care professional. You may get drowsy or dizzy. Do not drive, use machinery, or do anything that needs mental alertness until you know how this medicine affects you. Do not stand or sit up quickly, especially if you are an older patient. This reduces the risk of dizzy or fainting spells. Alcohol may interfere with the effect of this medicine. Avoid alcoholic drinks. Your mouth may get dry. Chewing sugarless gum or sucking hard candy, and drinking plenty of water may help. Contact your doctor if the problem does not go away or is severe. What side effects may I notice from receiving this medicine? Side effects that you should report to your doctor or health care professional as soon as possible: -allergic reactions like skin rash, itching or hives, swelling of the face, lips, or tongue -anxious -black, tarry stools -changes in vision -confusion -elevated mood, decreased need for sleep, racing thoughts, impulsive behavior -eye pain -fast, irregular heartbeat -feeling faint or lightheaded, falls -feeling agitated, angry, or irritable -hallucination, loss of contact with  reality -loss of balance or coordination -loss of memory -painful or prolonged erections -restlessness, pacing, inability to keep still -seizures -stiff muscles -suicidal thoughts or other mood changes -trouble sleeping -unusual bleeding or bruising -unusually weak or tired -vomiting Side effects that usually do not require medical attention (report to your doctor or health care professional if they continue or are bothersome): -change in appetite or weight -change in sex drive or performance -diarrhea -increased sweating -indigestion, nausea -tremors This list may not describe all possible side effects. Call your doctor for medical advice about side effects. You may report side effects to FDA at 1-800-FDA-1088. Where should I keep my medicine? Keep out of the reach of children. Store at room temperature between 15 and 30 degrees C (59 and 86 degrees F). Throw away any unused medicine after the expiration date. NOTE: This sheet is a summary. It may not cover all possible information. If you have questions about this medicine, talk to your doctor, pharmacist, or health care provider.  2018 Elsevier/Gold Standard (2016-12-13 14:17:49) Quetiapine tablets What is this medicine? QUETIAPINE (kwe TYE a peen) is an antipsychotic. It is used to treat schizophrenia and bipolar disorder, also known as manic-depression. This medicine may be used for other purposes; ask your health care provider or pharmacist if you have questions. COMMON BRAND NAME(S): Seroquel What should I tell my health care provider before I take this medicine? They need to know if you have any of these conditions: -brain tumor or head injury -breast cancer -cataracts -diabetes -difficulty swallowing -heart disease -kidney disease -liver disease -low blood counts, like low white cell, platelet, or red cell counts -low blood pressure or dizziness when standing up -Parkinson's disease -previous heart  attack -seizures -suicidal thoughts, plans, or attempt by you or a family member -thyroid disease -an unusual or   allergic reaction to quetiapine, other medicines, foods, dyes, or preservatives -pregnant or trying to get pregnant -breast-feeding How should I use this medicine? Take this medicine by mouth. Swallow it with a drink of water. Follow the directions on the prescription label. If it upsets your stomach you can take it with food. Take your medicine at regular intervals. Do not take it more often than directed. Do not stop taking except on the advice of your doctor or health care professional. A special MedGuide will be given to you by the pharmacist with each prescription and refill. Be sure to read this information carefully each time. Talk to your pediatrician regarding the use of this medicine in children. While this drug may be prescribed for children as young as 10 years for selected conditions, precautions do apply. Patients over age 65 years may have a stronger reaction to this medicine and need smaller doses. Overdosage: If you think you have taken too much of this medicine contact a poison control center or emergency room at once. NOTE: This medicine is only for you. Do not share this medicine with others. What if I miss a dose? If you miss a dose, take it as soon as you can. If it is almost time for your next dose, take only that dose. Do not take double or extra doses. What may interact with this medicine? Do not take this medicine with any of the following medications: -certain medicines for fungal infections like fluconazole, itraconazole, ketoconazole, posaconazole, voriconazole -cisapride -dofetilide -dronedarone -droperidol -grepafloxacin -halofantrine -phenothiazines like chlorpromazine, mesoridazine, thioridazine -pimozide -sparfloxacin -ziprasidone This medicine may also interact with the following medications: -alcohol -antiviral medicines for HIV or  AIDS -certain medicines for blood pressure -certain medicines for depression, anxiety, or psychotic disturbances like haloperidol, lorazepam -certain medicines for diabetes -certain medicines for Parkinson's disease -certain medicines for seizures like carbamazepine, phenobarbital, phenytoin -cimetidine -erythromycin -other medicines that prolong the QT interval (cause an abnormal heart rhythm) -rifampin -steroid medicines like prednisone or cortisone This list may not describe all possible interactions. Give your health care provider a list of all the medicines, herbs, non-prescription drugs, or dietary supplements you use. Also tell them if you smoke, drink alcohol, or use illegal drugs. Some items may interact with your medicine. What should I watch for while using this medicine? Visit your doctor or health care professional for regular checks on your progress. It may be several weeks before you see the full effects of this medicine. Your health care provider may suggest that you have your eyes examined prior to starting this medicine, and every 6 months thereafter. If you have been taking this medicine regularly for some time, do not suddenly stop taking it. You must gradually reduce the dose or your symptoms may get worse. Ask your doctor or health care professional for advice. Patients and their families should watch out for worsening depression or thoughts of suicide. Also watch out for sudden or severe changes in feelings such as feeling anxious, agitated, panicky, irritable, hostile, aggressive, impulsive, severely restless, overly excited and hyperactive, or not being able to sleep. If this happens, especially at the beginning of antidepressant treatment or after a change in dose, call your health care professional. You may get dizzy or drowsy. Do not drive, use machinery, or do anything that needs mental alertness until you know how this medicine affects you. Do not stand or sit up  quickly, especially if you are an older patient. This reduces the risk of dizzy or fainting   spells. Alcohol can increase dizziness and drowsiness. Avoid alcoholic drinks. Do not treat yourself for colds, diarrhea or allergies. Ask your doctor or health care professional for advice, some ingredients may increase possible side effects. This medicine can reduce the response of your body to heat or cold. Dress warm in cold weather and stay hydrated in hot weather. If possible, avoid extreme temperatures like saunas, hot tubs, very hot or cold showers, or activities that can cause dehydration such as vigorous exercise. What side effects may I notice from receiving this medicine? Side effects that you should report to your doctor or health care professional as soon as possible: -allergic reactions like skin rash, itching or hives, swelling of the face, lips, or tongue -difficulty swallowing -fast or irregular heartbeat -fever or chills, sore throat -fever with rash, swollen lymph nodes, or swelling of the face -increased hunger or thirst -increased urination -problems with balance, talking, walking -seizures -stiff muscles -suicidal thoughts or other mood changes -uncontrollable head, mouth, neck, arm, or leg movements -unusually weak or tired Side effects that usually do not require medical attention (report to your doctor or health care professional if they continue or are bothersome): -change in sex drive or performance -constipation -drowsy or dizzy -dry mouth -stomach upset -weight gain This list may not describe all possible side effects. Call your doctor for medical advice about side effects. You may report side effects to FDA at 1-800-FDA-1088. Where should I keep my medicine? Keep out of the reach of children. Store at room temperature between 15 and 30 degrees C (59 and 86 degrees F). Throw away any unused medicine after the expiration date. NOTE: This sheet is a summary. It may not  cover all possible information. If you have questions about this medicine, talk to your doctor, pharmacist, or health care provider.  2018 Elsevier/Gold Standard (2015-06-13 13:07:35)  

## 2018-03-11 ENCOUNTER — Encounter: Payer: Self-pay | Admitting: Psychiatry

## 2018-03-31 ENCOUNTER — Encounter: Payer: Self-pay | Admitting: Obstetrics and Gynecology

## 2018-03-31 ENCOUNTER — Ambulatory Visit (INDEPENDENT_AMBULATORY_CARE_PROVIDER_SITE_OTHER): Payer: 59 | Admitting: Obstetrics and Gynecology

## 2018-03-31 VITALS — BP 108/60 | Ht 64.0 in | Wt 144.0 lb

## 2018-03-31 DIAGNOSIS — Z30431 Encounter for routine checking of intrauterine contraceptive device: Secondary | ICD-10-CM

## 2018-03-31 NOTE — Progress Notes (Signed)
   IUD String Check  Subjctive: [name] presents for IUD string check.  She had a Mirena placed 4 weeks ago.  Since placement of her IUD she had occasional vaginal bleeding.  She denies cramping or discomfort.  She has had intercourse since placement.  She has not checked the strings.  She denies any fever, chills, nausea, vomiting, or other complaints.    Objective: BP 108/60   Ht 5\' 4"  (1.626 m)   Wt 144 lb (65.3 kg)   LMP 03/23/2018 (Approximate)   Breastfeeding? No   BMI 24.72 kg/m  Physical Exam  Constitutional: She is oriented to person, place, and time. She appears well-developed and well-nourished. No distress.  HENT:  Head: Normocephalic and atraumatic.  Nose: Nose normal.  Eyes: Conjunctivae are normal.  Neck: Normal range of motion.  Cardiovascular: Normal rate, regular rhythm and normal heart sounds.  Pulmonary/Chest: Effort normal and breath sounds normal.  Abdominal: Soft. She exhibits no distension. There is no tenderness. There is no rebound and no guarding.  Genitourinary: Vagina normal and uterus normal. Pelvic exam was performed with patient supine. There is no rash, tenderness or lesion on the right labia. There is no rash, tenderness or lesion on the left labia. Uterus is not tender. Cervix exhibits no motion tenderness and no discharge. Right adnexum displays no mass, no tenderness and no fullness. Left adnexum displays no mass, no tenderness and no fullness. No vaginal discharge found.  Genitourinary Comments: IUD strings visualized and were about 3cm in length. They were not trimmed today.  Musculoskeletal: Normal range of motion.  Lymphadenopathy:       Right: No inguinal adenopathy present.       Left: No inguinal adenopathy present.  Neurological: She is alert and oriented to person, place, and time.  Skin: Skin is warm and dry. No rash noted.  Psychiatric: She has a normal mood and affect. Her behavior is normal. Judgment normal.   Female  chaperone was present for the entirety of the pelvic exam  Assessment: 22 y.o. year old female status post prior Mirena IUD placement 4 week ago, doing well.  Plan: 1.  The patient was given instructions to check her IUD strings monthly and call with any problems or concerns.  She should call for fevers, chills, abnormal vaginal discharge, pelvic pain, or other complaints. 2.  She will return for a annual exam in 1 year.  All questions answered.  15 minutes spent in face to face discussion with > 50% spent in counseling, management, and coordination of care for her newly-placed IUD.  Risks and benefits of IUD discussed including the risks of irregular bleeding, cramping, infection, malpositioning, expulsion, which may require further procedures such as laparoscopy.  IUDs while effective at preventing pregnancy do not prevent transmission of sexually transmitted diseases and use of barrier methods for this purpose was discussed.  Low overall incidence of failure with 99.7% efficacy rate in typical use.    Thomasene MohairStephen Jeffrie Stander, MD 03/31/2018 11:55 AM

## 2018-04-07 ENCOUNTER — Ambulatory Visit: Payer: 59 | Admitting: Psychiatry

## 2018-04-08 ENCOUNTER — Ambulatory Visit (INDEPENDENT_AMBULATORY_CARE_PROVIDER_SITE_OTHER): Payer: 59 | Admitting: Psychiatry

## 2018-04-08 ENCOUNTER — Encounter: Payer: Self-pay | Admitting: Psychiatry

## 2018-04-08 ENCOUNTER — Other Ambulatory Visit: Payer: Self-pay

## 2018-04-08 VITALS — BP 117/77 | HR 97 | Temp 97.8°F | Ht 64.0 in | Wt 140.2 lb

## 2018-04-08 DIAGNOSIS — F1994 Other psychoactive substance use, unspecified with psychoactive substance-induced mood disorder: Secondary | ICD-10-CM

## 2018-04-08 DIAGNOSIS — F1311 Sedative, hypnotic or anxiolytic abuse, in remission: Secondary | ICD-10-CM | POA: Diagnosis not present

## 2018-04-08 DIAGNOSIS — F17200 Nicotine dependence, unspecified, uncomplicated: Secondary | ICD-10-CM | POA: Diagnosis not present

## 2018-04-08 DIAGNOSIS — F1121 Opioid dependence, in remission: Secondary | ICD-10-CM

## 2018-04-08 DIAGNOSIS — F1221 Cannabis dependence, in remission: Secondary | ICD-10-CM | POA: Diagnosis not present

## 2018-04-08 DIAGNOSIS — F411 Generalized anxiety disorder: Secondary | ICD-10-CM

## 2018-04-08 MED ORDER — SERTRALINE HCL 50 MG PO TABS: 50.0000 mg | ORAL_TABLET | Freq: Every day | ORAL | 1 refills | Status: DC

## 2018-04-08 MED ORDER — QUETIAPINE FUMARATE 50 MG PO TABS: 50.0000 mg | ORAL_TABLET | Freq: Every day | ORAL | 1 refills | Status: DC

## 2018-04-08 NOTE — Progress Notes (Signed)
BH MD OP Progress Note  04/08/2018 10:18 PM Holly Farrell  MRN:  [id number]  Chief Complaint: ' I am here for a follow up visit." Chief Complaint    Follow-up; Medication Refill     HPI: Holly Farrell is a 22 year old Caucasian female, single ,student at [employer],lives  in [place], presented to the clinic today for a follow-up visit.  Patient today reports she continues to have some racing thoughts and restlessness at night.  She is compliant on the Zoloft and Seroquel as prescribed.  She reports she has been taking the Seroquel 1 whole pill of 25 mg.  She would like her medication to be readjusted today.  She reports she continues to struggle with some restlessness racing thoughts rumination and so on at night.  She also reports she continues to be anxious and restless during the day.  She denies any suicidality or perceptual disturbances.  She does report continued mood lability off and on.  She also reports using CBD oil on a daily basis as well as she continues to use Vape.  Denies using any other drugs.  She does have a history of using Xanax, cocaine and opioids in the past.  She reports school is going well.  She continues to have social support from her.  Her mother helps take care of her daughter who is 22-year-old.  She continues to enjoy her daughter. Visit Diagnosis:    ICD-10-CM   1. GAD (generalized anxiety disorder) F41.1 sertraline (ZOLOFT) 50 MG tablet  2. Tobacco use disorder F17.200   3. Cannabis use disorder, severe, in sustained remission (HCC) F12.21   4. Opioid use disorder, moderate, in sustained remission (HCC) F11.21   5. 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  Trials of Latuda, Zoloft, Seroquel.  Past Medical History:  Past Medical History:  Diagnosis Date  .  Anxiety   . Bipolar disorder (HCC)   . Depression   . Family history of ovarian cancer     Past Surgical History:  Procedure Laterality Date  . 3rd degree laceration  03/2017  . TONSILLECTOMY      Family Psychiatric History: I have reviewed family psychiatric history and my progress note on 03/10/2018.  Family History:  Family History  Problem Relation Age of Onset  . Depression Mother   . Anxiety disorder Mother   . Bipolar disorder Mother   . Hypertension Mother   . Asthma Father   . Alcohol abuse Father   . Sleep disorder Brother   . Hypertension Maternal Grandmother   . Bipolar disorder Maternal Grandfather   . Ovarian cancer Paternal Grandmother     Social History: Reports she had an okay childhood.  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  She has a daughter who is almost 22-year-old ,Holly Farrell .  She continues to be single.  She lives in [place].  She has a good relationship with her father.  Her mom continues to be supportive. Social History   Socioeconomic History  . Marital status: Single    Spouse name: Not on file  . Number of children: Not on file  . Years of education: Not on file  . Highest education level: Not on file  Occupational History  . Not on file  Social Needs  . Financial resource strain: Not on file  . Food insecurity:    Worry: Not on file    Inability: Not on file  . Transportation needs:    Medical: Not on file    Non-medical: Not on file  Tobacco Use  . Smoking status: Current Every Day Smoker    Packs/day: 0.50    Types: Cigarettes    Start date: 10/03/2014  . Smokeless tobacco: Never Used  Substance and Sexual Activity  . Alcohol use: Yes    Alcohol/week: 0.0 - 7.2 oz  . Drug use: Yes    Types: Marijuana, Cocaine    Comment: marjuana last used yesterday cocaine last used 12-24-15 also uses Xanax  . Sexual activity: Yes    Birth control/protection: None,  Pill  Lifestyle  . Physical activity:    Days per week: Not on file    Minutes per session: Not on file  . Stress: Not on file  Relationships  . Social connections:    Talks on phone: Not on file    Gets together: Not on file    Attends religious service: Not on file    Active member of club or organization: Not on file    Attends meetings of clubs or organizations: Not on file    Relationship status: Not on file  Other Topics Concern  . Not on file  Social History Narrative  . Not on file    Allergies:  Allergies  Allergen Reactions  . Codeine     Patient stats she might have had an allergic reaction at 22 y/o, but then states she took it when older with a mild issue (nausea and vomiting)  . Latex Swelling    Swelling of lips and rash  . Lamictal [Lamotrigine] Rash    Metabolic Disorder Labs: Lab Results  Component Value Date   HGBA1C 5.1 06/26/2016   Lab Results  Component Value Date   PROLACTIN 43.9 (H) 06/26/2016   Lab Results  Component Value Date   CHOL 220 (H) 06/26/2016   TRIG 102 06/26/2016   HDL 65 06/26/2016   CHOLHDL 3.4 06/26/2016   VLDL 20 06/26/2016   LDLCALC 135 (H) 06/26/2016   LDLCALC 153 (H) 10/30/2015   Lab Results  Component Value Date   TSH 3.386 06/26/2016    Therapeutic Level Labs: No results found for: LITHIUM No results found for: VALPROATE No components found for:  CBMZ  Current Medications: Current Outpatient Medications  Medication Sig Dispense Refill  . levonorgestrel (MIRENA) 20 MCG/24HR IUD 1 each by Intrauterine route once.    . sertraline (ZOLOFT) 50 MG tablet Take 1 tablet (50 mg total) by mouth daily. 30 tablet 1  . QUEtiapine (SEROQUEL) 50 MG tablet Take 1-1.5 tablets (50-75 mg total) by mouth at bedtime. 45 tablet 1   Current Facility-Administered Medications  Medication Dose Route Frequency Provider Last Rate Last Dose  . prenatal multivitamin tablet 1 tablet  1 tablet Oral Q1200 Brandy Hale, MD          Musculoskeletal: Strength & Muscle Tone: within normal limits Gait & Station: normal Patient leans: N/A  Psychiatric Specialty Exam: Review of Systems  Psychiatric/Behavioral: The patient is nervous/anxious and has insomnia.   All other systems reviewed and are negative.   Blood pressure 117/77, pulse 97, temperature 97.8 F (36.6 C), temperature source Oral, height 5\' 4"  (1.626 m), weight 140 lb 3.2 oz (63.6 kg), last menstrual period 03/23/2018, not currently breastfeeding.Body mass index  is 24.07 kg/m.  General Appearance: Casual  Eye Contact:  Fair  Speech:  Normal Rate  Volume:  Normal  Mood:  Anxious  Affect:  Congruent  Thought Process:  Goal Directed and Descriptions of Associations: Intact  Orientation:  Full (Time, Place, and Person)  Thought Content: Logical   Suicidal Thoughts:  No  Homicidal Thoughts:  No  Memory:  Immediate;   Fair Recent;   Fair Remote;   Fair  Judgement:  Fair  Insight:  Fair  Psychomotor Activity:  Restlessness  Concentration:  Concentration: Fair and Attention Span: Fair  Recall:  [employer] of Knowledge: Fair  Language: Fair  Akathisia:  No  Handed:  Right  AIMS (if indicated):denies rigidity, stiffness  Assets:  Communication Skills Desire for Improvement Housing Intimacy Social Support  ADL's:  Intact  Cognition: WNL  Sleep:  improved   Screenings: AIMS     Admission (Discharged) from 06/25/2016 in [facility] MEDICINE  AIMS Total Score  0       Assessment and Plan: Gayla is a 22 year old Caucasian female who has a history of bipolar disorder, anxiety disorder, polysubstance abuse, noncompliant with medications in the past, presented to the clinic today for a follow-up visit.  Patient continues to struggle with some racing thoughts, anxiety symptoms and sleep issues.  She is biologically predisposed given her history of polysubstance abuse, history of family history of mental health problems.  She also  has a history of suicide attempt in the past.  She currently denies suicidality.  Discussed plan as noted below.  Plan For bipolar disorder likely substance-induced Continue Zoloft 50 mg p.o. daily Increase Seroquel to 50-75 mg p.o. Nightly.  For generalized anxiety disorder Zoloft 50 mg p.o. daily Discussed referral for CBT  For insomnia  increase Seroquel to 50-75 mg p.o. nightly.  Discussed with patient about getting her labs done including lipid panel, hemoglobin A1c, prolactin, CBC, CMP, TSH, vitamin B12, folate, vitamin D.  Patient reports she cannot afford it financially at this time.  She will talk to her PMD.  Follow-up in clinic in 4 weeks or sooner if needed.  More than 50 % of the time was spent for psychoeducation and supportive psychotherapy and care coordination.  This note was generated in part or whole with voice recognition software. Voice recognition is usually quite accurate but there are transcription errors that can and very often do occur. I apologize for any typographical errors that were not detected and corrected.       Jomarie Longs, MD 04/08/2018, 10:18 PM

## 2018-04-08 NOTE — Patient Instructions (Signed)
Please start taking Seroquel 50 mg at bedtime for two weeks and then start taking 75 mg at bedtime after two weeks.

## 2018-04-14 ENCOUNTER — Ambulatory Visit (INDEPENDENT_AMBULATORY_CARE_PROVIDER_SITE_OTHER): Payer: 59 | Admitting: Licensed Clinical Social Worker

## 2018-04-14 DIAGNOSIS — F411 Generalized anxiety disorder: Secondary | ICD-10-CM | POA: Diagnosis not present

## 2018-04-27 NOTE — Progress Notes (Signed)
   THERAPIST PROGRESS NOTE  Session Time: 72  Participation Level: Active  Behavioral Response: CasualAlertEuthymic  Type of Therapy: Individual Therapy  Treatment Goals addressed: Coping  Interventions: CBT and Motivational Interviewing  Summary: Holly Farrell is a 22 y.o. female who presents with continued symptoms of her diagnosis.   Therapist provided active listening as Patient shared details about herself, her current mood and stressors.  Therapist provided support and feedback for Patient as she expressed concern about her level of frustration with her daughter's father.  Therapist listened and challenged Patient to explore alternate ways of explaining her point without engaging in negative verbal expressions.  Therapist assisted Patient and provided additional support and feedback to address Patient's concerns regarding her relationship status.  Therapist shifted focus of discussion to managing current symptoms, avoiding triggers improving problem solving skills.    Suicidal/Homicidal: No  Plan: Return again in 2 weeks.  Diagnosis: Axis I: Generalized Anxiety Disorder    Axis II: No diagnosis    Marinda Elk, LCSW 04/14/2018

## 2018-04-30 ENCOUNTER — Other Ambulatory Visit: Payer: Self-pay | Admitting: Psychiatry

## 2018-05-04 ENCOUNTER — Other Ambulatory Visit: Payer: Self-pay | Admitting: Psychiatry

## 2018-05-04 DIAGNOSIS — F411 Generalized anxiety disorder: Secondary | ICD-10-CM

## 2018-05-04 DIAGNOSIS — F1994 Other psychoactive substance use, unspecified with psychoactive substance-induced mood disorder: Secondary | ICD-10-CM

## 2018-05-05 ENCOUNTER — Other Ambulatory Visit: Payer: Self-pay

## 2018-05-05 ENCOUNTER — Encounter: Payer: Self-pay | Admitting: Psychiatry

## 2018-05-05 ENCOUNTER — Ambulatory Visit (INDEPENDENT_AMBULATORY_CARE_PROVIDER_SITE_OTHER): Payer: 59 | Admitting: Psychiatry

## 2018-05-05 VITALS — BP 138/83 | HR 83 | Temp 97.7°F | Wt 139.8 lb

## 2018-05-05 DIAGNOSIS — F1221 Cannabis dependence, in remission: Secondary | ICD-10-CM | POA: Diagnosis not present

## 2018-05-05 DIAGNOSIS — F1311 Sedative, hypnotic or anxiolytic abuse, in remission: Secondary | ICD-10-CM

## 2018-05-05 DIAGNOSIS — F411 Generalized anxiety disorder: Secondary | ICD-10-CM

## 2018-05-05 DIAGNOSIS — F1994 Other psychoactive substance use, unspecified with psychoactive substance-induced mood disorder: Secondary | ICD-10-CM

## 2018-05-05 DIAGNOSIS — F1121 Opioid dependence, in remission: Secondary | ICD-10-CM | POA: Diagnosis not present

## 2018-05-05 DIAGNOSIS — F17200 Nicotine dependence, unspecified, uncomplicated: Secondary | ICD-10-CM

## 2018-05-05 DIAGNOSIS — F1394 Sedative, hypnotic or anxiolytic use, unspecified with sedative, hypnotic or anxiolytic-induced mood disorder: Secondary | ICD-10-CM | POA: Diagnosis not present

## 2018-05-05 MED ORDER — SERTRALINE HCL 50 MG PO TABS: 50.0000 mg | ORAL_TABLET | Freq: Every day | ORAL | 0 refills | Status: DC

## 2018-05-05 MED ORDER — HYDROXYZINE HCL 10 MG PO TABS: 10.0000 mg | ORAL_TABLET | Freq: Every evening | ORAL | 2 refills | Status: DC | PRN

## 2018-05-05 NOTE — Progress Notes (Signed)
BH MD OP Progress Note  05/05/2018 4:48 AM Holly Farrell  MRN:  [id number]  Chief Complaint: ' I am here for follow up." Chief Complaint    Follow-up; Medication Refill     HPI: Holly Farrell is a 22 year old Caucasian female, single, student at [employer], lives in [place], presented to the clinic today for a follow-up visit.  Patient has a history of anxiety disorder.  Patient today reports she tried the Seroquel as prescribed however she felt more irritable.  She reports her mother also observed her as being more irritable.  She reports she had stopped taking the Seroquel.  She reports she continues to be compliant with Zoloft and would like to give the Zoloft more time.  She does not want her Zoloft dosage to be increased today.    She reports being a single mother continues to be stressful.  She reports she sometimes questions her ability to be a responsible mother for her child.  She however wants to be there for her daughter.  Patient also reports good support system from her mother.  Patient reports her classes at [employer] is going well.  She is happy that the semester is going to end soon.  She has not decided whether she is going to take summer classes yet or not.  She reports sleep as restless.  The Seroquel was also prescribed for sleep.  She does report past trials of hydroxyzine but it makes her drowsy.  Discussed with patient to make use of hydroxyzine at bedtime for anxiety and sleep.  She agrees with plan.  She denies any suicidality.  She denies any perceptual disturbances.     Visit Diagnosis:    ICD-10-CM   1. GAD (generalized anxiety disorder) F41.1 sertraline (ZOLOFT) 50 MG tablet  2. Tobacco use disorder F17.200   3. Cannabis use disorder, severe, in sustained remission (HCC) F12.21   4. Opioid use disorder, moderate, in sustained remission (HCC) F11.21   5. 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  Past trials of Latuda, Zoloft, Seroquel.  Past Medical History:  Past Medical History:  Diagnosis Date  . Anxiety   . Bipolar disorder (HCC)   . Depression   . Family history of ovarian cancer     Past Surgical History:  Procedure Laterality Date  . 3rd degree laceration  03/2017  . TONSILLECTOMY      Family Psychiatric History: Reviewed family psychiatric history from my progress note on 03/10/2018.  Family History:  Family History  Problem Relation Age of Onset  . Depression Mother   . Anxiety disorder Mother   . Bipolar disorder Mother   . Hypertension Mother   . Asthma Father   . Alcohol abuse Father   . Sleep disorder Brother   . Hypertension Maternal Grandmother   . Bipolar disorder Maternal Grandfather   . 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  She is a single mother.  She has a 49-year-old daughter, Holly Farrell.  Her mother is supportive. Social History   Socioeconomic History  . Marital status: Single    Spouse name: Not on file  . Number of  children: Not on file  . Years of education: Not on file  . Highest education level: Not on file  Occupational History  . Not on file  Social Needs  . Financial resource strain: Not on file  . Food insecurity:    Worry: Not on file    Inability: Not on file  . Transportation needs:    Medical: Not on file    Non-medical: Not on file  Tobacco Use  . Smoking status: Current Every Day Smoker    Packs/day: 0.50    Types: Cigarettes    Start date: 10/03/2014  . Smokeless tobacco: Never Used  Substance and Sexual Activity  . Alcohol use: Yes    Alcohol/week: 0.0 - 7.2 oz  . Drug use: Yes    Types:  Marijuana, Cocaine    Comment: marjuana last used yesterday cocaine last used 12-24-15 also uses Xanax  . Sexual activity: Yes    Birth control/protection: None, Pill  Lifestyle  . Physical activity:    Days per week: Not on file    Minutes per session: Not on file  . Stress: Not on file  Relationships  . Social connections:    Talks on phone: Not on file    Gets together: Not on file    Attends religious service: Not on file    Active member of club or organization: Not on file    Attends meetings of clubs or organizations: Not on file    Relationship status: Not on file  Other Topics Concern  . Not on file  Social History Narrative  . Not on file    Allergies:  Allergies  Allergen Reactions  . Codeine     Patient stats she might have had an allergic reaction at 22 y/o, but then states she took it when older with a mild issue (nausea and vomiting)  . Latex Swelling    Swelling of lips and rash  . Lamictal [Lamotrigine] Rash    Metabolic Disorder Labs: Lab Results  Component Value Date   HGBA1C 5.1 06/26/2016   Lab Results  Component Value Date   PROLACTIN 43.9 (H) 06/26/2016   Lab Results  Component Value Date   CHOL 220 (H) 06/26/2016   TRIG 102 06/26/2016   HDL 65 06/26/2016   CHOLHDL 3.4 06/26/2016   VLDL 20 06/26/2016   LDLCALC 135 (H) 06/26/2016   LDLCALC 153 (H) 10/30/2015   Lab Results  Component Value Date   TSH 3.386 06/26/2016    Therapeutic Level Labs: No results found for: LITHIUM No results found for: VALPROATE No components found for:  CBMZ  Current Medications: Current Outpatient Medications  Medication Sig Dispense Refill  . levonorgestrel (MIRENA) 20 MCG/24HR IUD 1 each by Intrauterine route once.    . sertraline (ZOLOFT) 50 MG tablet Take 1 tablet (50 mg total) by mouth daily. 90 tablet 0  . hydrOXYzine (ATARAX/VISTARIL) 10 MG tablet Take 1-2 tablets (10-20 mg total) by mouth at bedtime as needed for anxiety (sleep). 60 tablet 2    Current Facility-Administered Medications  Medication Dose Route Frequency Provider Last Rate Last Dose  . prenatal multivitamin tablet 1 tablet  1 tablet Oral Q1200 Brandy Hale, MD         Musculoskeletal: Strength & Muscle Tone: within normal limits Gait & Station: normal Patient leans: N/A  Psychiatric Specialty Exam: Review of Systems  Psychiatric/Behavioral: The patient is nervous/anxious and has insomnia.   All other systems reviewed and are negative.  Blood pressure 138/83, pulse 83, temperature 97.7 F (36.5 C), temperature source Oral, weight 139 lb 12.8 oz (63.4 kg), not currently breastfeeding.Body mass index is 24 kg/m.  General Appearance: Casual  Eye Contact:  Fair  Speech:  Clear and Coherent  Volume:  Normal  Mood:  Anxious and Dysphoric  Affect:  Congruent  Thought Process:  Goal Directed and Descriptions of Associations: Intact  Orientation:  Full (Time, Place, and Person)  Thought Content: Logical   Suicidal Thoughts:  No  Homicidal Thoughts:  No  Memory:  Immediate;   Fair Recent;   Fair Remote;   Fair  Judgement:  Fair  Insight:  Fair  Psychomotor Activity:  Normal  Concentration:  Concentration: Fair and Attention Span: Fair  Recall:  [employer] of Knowledge: Fair  Language: Fair  Akathisia:  No  Handed:  Right  AIMS (if indicated): denies tremors, rigidity, stiffness  Assets:  Communication Skills Desire for Improvement Housing Social Support Talents/Skills  ADL's:  Intact  Cognition: WNL  Sleep:  Poor   Screenings: AIMS     Admission (Discharged) from 06/25/2016 in [facility]PAT[facility] BE[facility]AV[facility]AL MEDICINE  AIMS Total Score  0       Assessment and Plan: Colin Mulders is a 22 year old Caucasian female who has a history of bipolar disorder, anxiety disorder, polysubstance abuse, noncompliant with medications in the past, presented to the clinic today for a follow-up visit.  Patient continues to struggle with some irritability and sleep  problems.  Discussed with patient her irritability could also be due to her not being able to sleep at night.  Discussed medication readjustment.  Plan as noted below.  Plan For bipolar disorder likely substance-induced Continue Zoloft 50 mg p.o. daily Discontinue Seroquel for side effects.  Patient reports she does not want another mood stabilizer at this time and would like to monitor her symptoms closely.  She will reach out to writer if her symptoms worsen.  For GAD Zoloft 50 mg p.o. daily. Add hydroxyzine 10-20 mg p.o. nightly as needed for anxiety.  For insomnia Add hydroxyzine 10-20 mg p.o. nightly as needed  History of polysubstance abuse-patient continues to be sober.  Will Bortnick continue to monitor her closely.  Follow-up in clinic in 4 weeks or sooner if needed.  More than 50 % of the time was spent for psychoeducation and supportive psychotherapy and care coordination. This note was generated in part or whole with voice recognition software. Voice recognition is usually quite accurate but there are transcription errors that can and very often do occur. I apologize for any typographical errors that were not detected and corrected.         Jomarie Longs, MD 05/06/2018, 8:55 AM

## 2018-05-06 ENCOUNTER — Encounter: Payer: Self-pay | Admitting: Psychiatry

## 2018-05-07 ENCOUNTER — Ambulatory Visit: Payer: 59 | Admitting: Obstetrics and Gynecology

## 2018-05-08 ENCOUNTER — Ambulatory Visit (INDEPENDENT_AMBULATORY_CARE_PROVIDER_SITE_OTHER): Payer: 59 | Admitting: Obstetrics and Gynecology

## 2018-05-08 ENCOUNTER — Encounter: Payer: Self-pay | Admitting: Obstetrics and Gynecology

## 2018-05-08 VITALS — BP 114/72 | Ht 64.0 in | Wt 141.0 lb

## 2018-05-08 DIAGNOSIS — K1370 Unspecified lesions of oral mucosa: Secondary | ICD-10-CM

## 2018-05-08 DIAGNOSIS — Z20828 Contact with and (suspected) exposure to other viral communicable diseases: Secondary | ICD-10-CM

## 2018-05-08 MED ORDER — VALACYCLOVIR HCL 1 G PO TABS: 2000.0000 mg | ORAL_TABLET | Freq: Two times a day (BID) | ORAL | 0 refills | Status: AC

## 2018-05-08 NOTE — Progress Notes (Signed)
Obstetrics & Gynecology Office Visit   Chief Complaint  Patient presents with  . STD check   History of Present Illness: 22 y.o. G1P1001 who presents with concern for oral herpes.  Over the past three days she has had multiple "sores" in her mouth.  She drank after her "friend" four nights ago.  She states her friend has told her that she has oral herpes. Then, her friend told her that she did not have oral herpes.  Now that she has had these sores in her mouth, she is concerned that she has herpes.  Now she only has one sore in her mouth.  She is unsure whether she has seen sores around her mouth.  She has been putting Abreva on her sores and they originally were huge, per her description.  It hurt her to talk. She only has one in her mouth now that is small.  She has not put Abreva on the one in her mouth.  She denies any history of herpes.    Past Medical History:  Diagnosis Date  . Anxiety   . Bipolar disorder (HCC)   . Depression   . Family history of ovarian cancer    Past Surgical History:  Procedure Laterality Date  . 3rd degree laceration  03/2017  . TONSILLECTOMY     Gynecologic History: No LMP recorded (lmp unknown). (Menstrual status: IUD).  Obstetric History: G1P1001  Family History  Problem Relation Age of Onset  . Depression Mother   . Anxiety disorder Mother   . Bipolar disorder Mother   . Hypertension Mother   . Asthma Father   . Alcohol abuse Father   . Sleep disorder Brother   . Hypertension Maternal Grandmother   . Bipolar disorder Maternal Grandfather   . Ovarian cancer Paternal Grandmother    Social History   Socioeconomic History  . Marital status: Single    Spouse name: Not on file  . Number of children: Not on file  . Years of education: Not on file  . Highest education level: Not on file  Occupational History  . Not on file  Social Needs  . Financial resource strain: Not on file  . Food insecurity:    Worry: Not on file    Inability: Not  on file  . Transportation needs:    Medical: Not on file    Non-medical: Not on file  Tobacco Use  . Smoking status: Current Every Day Smoker    Packs/day: 0.50    Types: Cigarettes    Start date: 10/03/2014  . Smokeless tobacco: Never Used  Substance and Sexual Activity  . Alcohol use: Yes    Alcohol/week: 0.0 - 7.2 oz  . Drug use: Yes    Types: Marijuana, Cocaine    Comment: marjuana last used yesterday cocaine last used 12-24-15 also uses Xanax  . Sexual activity: Yes    Birth control/protection: IUD  Lifestyle  . Physical activity:    Days per week: 0 days    Minutes per session: 0 min  . Stress: Not on file  Relationships  . Social connections:    Talks on phone: Not on file    Gets together: Not on file    Attends religious service: Not on file    Active member of club or organization: Not on file    Attends meetings of clubs or organizations: Not on file    Relationship status: Not on file  . Intimate partner violence:    Fear  of current or ex partner: Not on file    Emotionally abused: Not on file    Physically abused: Not on file    Forced sexual activity: Not on file  Other Topics Concern  . Not on file  Social History Narrative  . Not on file   Allergies  Allergen Reactions  . Codeine     Patient stats she might have had an allergic reaction at 22 y/o, but then states she took it when older with a mild issue (nausea and vomiting)  . Latex Swelling    Swelling of lips and rash  . Lamictal [Lamotrigine] Rash   Prior to Admission medications   Medication Sig Start Date End Date Taking? Authorizing Provider  hydrOXYzine (ATARAX/VISTARIL) 10 MG tablet Take 1-2 tablets (10-20 mg total) by mouth at bedtime as needed for anxiety (sleep). 05/05/18  Yes Jomarie Longs, MD  levonorgestrel (MIRENA) 20 MCG/24HR IUD 1 each by Intrauterine route once.   Yes [provider]  QUEtiapine (SEROQUEL) 50 MG tablet Take 50 mg by mouth at bedtime.   Yes [provider]  sertraline (ZOLOFT) 50 MG tablet Take 1 tablet (50 mg total) by mouth daily. 05/05/18  Yes Jomarie Longs, MD    Review of Systems  Constitutional: Negative.   HENT: Negative for congestion, ear discharge, ear pain, hearing loss, nosebleeds, sinus pain, sore throat and tinnitus.        Mouth sores  Eyes: Negative.   Respiratory: Negative.  Negative for stridor.   Cardiovascular: Negative.   Gastrointestinal: Negative.   Genitourinary: Negative.   Musculoskeletal: Negative.   Skin: Negative.   Neurological: Negative.   Endo/Heme/Allergies: Negative.      Physical Exam BP 114/72   Ht  (1.626 m)   Wt 141 lb (64 kg)   LMP  (LMP Unknown)   BMI 24.20 kg/m  No LMP recorded (lmp unknown). (Menstrual status: IUD). Physical Exam  Constitutional: She is oriented to person, place, and time. She appears well-developed and well-nourished. No distress.  HENT:  Head: Normocephalic and atraumatic.  A single, punctate ulceration just inside the right apex of her lip. No bleeding. No other lesions noted.  No oropharyngeal exudate, erythema, or cobblestoning. No enlargement of the uvula.   Eyes: Conjunctivae are normal. No scleral icterus.  Neurological: She is alert and oriented to person, place, and time. No cranial nerve deficit.  Psychiatric: She has a normal mood and affect. Her behavior is normal. Judgment normal.   Assessment: 22 y.o. G76P1001 female here for  1. Herpes exposure   2. Oral lesion      Plan: Problem List Items Addressed This Visit    None    Visit Diagnoses    Herpes exposure    -  Primary   Relevant Medications   valACYclovir (VALTREX) 1000 MG tablet   Other Relevant Orders   Herpes simplex virus culture   Oral lesion         Patient reassured. The history provided is not completely consistent with oral HSV infection. Culture swab taken today in punctate lesion.  Will provide empiric valtrex given appearance of single lesion noted.    15  minutes spent in face to face discussion with > 50% spent in counseling,management, and coordination of care of her oral lesion and possible herpes exposure.   Thomasene Mohair, MD 05/08/2018 5:15 PM

## 2018-05-10 LAB — HERPES SIMPLEX VIRUS CULTURE

## 2018-05-19 ENCOUNTER — Telehealth: Payer: Self-pay

## 2018-05-19 DIAGNOSIS — F411 Generalized anxiety disorder: Secondary | ICD-10-CM

## 2018-05-19 MED ORDER — HYDROXYZINE HCL 10 MG PO TABS: 10.0000 mg | ORAL_TABLET | Freq: Every evening | ORAL | 0 refills | Status: DC | PRN

## 2018-05-19 MED ORDER — SERTRALINE HCL 50 MG PO TABS: 50.0000 mg | ORAL_TABLET | Freq: Every day | ORAL | 0 refills | Status: DC

## 2018-05-19 NOTE — Telephone Encounter (Signed)
received a phone call stating that they need a 90 day supply to them because of the pt insurance.  that pt is to [facility] and a 90 day thru [facility].

## 2018-05-19 NOTE — Telephone Encounter (Signed)
Sent 90 days of zoloft and vistaril to [facility].

## 2018-05-25 ENCOUNTER — Ambulatory Visit: Payer: 59 | Admitting: Obstetrics and Gynecology

## 2018-06-02 ENCOUNTER — Other Ambulatory Visit: Payer: Self-pay

## 2018-06-02 ENCOUNTER — Ambulatory Visit: Payer: 59 | Admitting: Licensed Clinical Social Worker

## 2018-06-02 ENCOUNTER — Encounter: Payer: Self-pay | Admitting: Psychiatry

## 2018-06-02 ENCOUNTER — Ambulatory Visit (INDEPENDENT_AMBULATORY_CARE_PROVIDER_SITE_OTHER): Payer: 59 | Admitting: Psychiatry

## 2018-06-02 VITALS — BP 136/98 | HR 88 | Wt 139.2 lb

## 2018-06-02 DIAGNOSIS — F1121 Opioid dependence, in remission: Secondary | ICD-10-CM

## 2018-06-02 DIAGNOSIS — F17200 Nicotine dependence, unspecified, uncomplicated: Secondary | ICD-10-CM | POA: Diagnosis not present

## 2018-06-02 DIAGNOSIS — F411 Generalized anxiety disorder: Secondary | ICD-10-CM | POA: Diagnosis not present

## 2018-06-02 DIAGNOSIS — F1994 Other psychoactive substance use, unspecified with psychoactive substance-induced mood disorder: Secondary | ICD-10-CM | POA: Diagnosis not present

## 2018-06-02 DIAGNOSIS — F1421 Cocaine dependence, in remission: Secondary | ICD-10-CM

## 2018-06-02 DIAGNOSIS — F1394 Sedative, hypnotic or anxiolytic use, unspecified with sedative, hypnotic or anxiolytic-induced mood disorder: Secondary | ICD-10-CM

## 2018-06-02 DIAGNOSIS — F1221 Cannabis dependence, in remission: Secondary | ICD-10-CM

## 2018-06-02 MED ORDER — QUETIAPINE FUMARATE ER 50 MG PO TB24: 50.0000 mg | ORAL_TABLET | Freq: Every day | ORAL | 1 refills | Status: DC

## 2018-06-02 MED ORDER — QUETIAPINE FUMARATE ER 50 MG PO TB24: 50.0000 mg | ORAL_TABLET | Freq: Every day | ORAL | 0 refills | Status: DC

## 2018-06-02 NOTE — Progress Notes (Signed)
BH MD  OP Progress Note  06/02/2018 2:54 PM Holly Farrell  MRN:  [id number]  Chief Complaint: Pt states " I am here for follow up.' Chief Complaint    Follow-up; Medication Refill     HPI: Holly Farrell is a 22 year old Caucasian female, single, student at [employer], lives in [place], presented to the clinic today for a follow-up visit.  Patient has a history of anxiety disorder.  Patient also has a history of polysubstance abuse in the past and some bipolar symptoms while she was abusing substances.  Patient today returns for an appointment with her 20-year-old daughter- Holly Farrell  Patient reports she is currently doing well on the current medication regimen.  She denies any significant anxiety symptoms.  She reports even though she had told writer during her last visit that she did not like the effect of Seroquel she had to go back on it since she was not sleeping well without it.  She reports after she started the Seroquel back she has noticed a tremendous improvement in her sleep.  She does have some grogginess in the morning when she wakes up however she reports the 50 mg as helpful.  Discussed with patient that she can be changed into an extended release if she agrees.  Provided medication education.  And patient agrees with plan.  Patient currently denies any other problems.  She reports she has started working part-time now that she is out of college.  She has her summer break.  She reports she has been spending more time with her daughter.  Patient reports she continues to want to quit smoking however has been using E cigarettes.  She reports she likes the effect of the cigarettes and is not ready to quit using it.   Visit Diagnosis:    ICD-10-CM   1. GAD (generalized anxiety disorder) F41.1   2. Opioid use disorder, moderate, in sustained remission (HCC) F11.21   3. Cannabis use disorder, severe, in sustained remission (HCC) F12.21   4. Tobacco use disorder F17.200   5. 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 Cocaine use disorder, moderate, in sustained remission (HCC) F14.21   7. 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  Past trials of Latuda, Zoloft, Seroquel.  Past Medical History:  Past Medical History:  Diagnosis Date  . Anxiety   . Bipolar disorder (HCC)   . Depression   . Family history of ovarian cancer     Past Surgical History:  Procedure Laterality Date  . 3rd degree laceration  03/2017  . TONSILLECTOMY      Family Psychiatric History: Reviewed family psychiatric history from my progress note on 03/10/2018.  Family History:  Family History  Problem Relation Age of Onset  . Depression Mother   . Anxiety disorder Mother   . Bipolar disorder Mother   . Hypertension Mother   . Asthma Father   . Alcohol abuse Father   . Sleep disorder Brother   . Hypertension Maternal Grandmother   . Bipolar disorder Maternal Grandfather   . 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  She is a single mother.  She has a 59-year-old daughter, Holly Farrell.  Her mother continues to be supportive. Social History  Socioeconomic History  . Marital status: Single    Spouse name: Not on file  . Number of children: Not on file  . Years of education: Not on file  . Highest education level: Not on file  Occupational History  . Not on file  Social Needs  . Financial resource strain: Not on file  . Food insecurity:    Worry: Not on file    Inability: Not on file  . Transportation needs:    Medical: Not on file    Non-medical: Not on file  Tobacco Use  . Smoking status: Current Every Day Smoker    Packs/day: 0.50    Types: Cigarettes    Start date: 10/03/2014  . Smokeless  tobacco: Never Used  Substance and Sexual Activity  . Alcohol use: Yes    Alcohol/week: 0.0 - 7.2 oz  . Drug use: Yes    Types: Marijuana, Cocaine    Comment: marjuana last used yesterday cocaine last used 12-24-15 also uses Xanax  . Sexual activity: Yes    Birth control/protection: IUD  Lifestyle  . Physical activity:    Days per week: 0 days    Minutes per session: 0 min  . Stress: Not on file  Relationships  . Social connections:    Talks on phone: Not on file    Gets together: Not on file    Attends religious service: Not on file    Active member of club or organization: Not on file    Attends meetings of clubs or organizations: Not on file    Relationship status: Not on file  Other Topics Concern  . Not on file  Social History Narrative  . Not on file    Allergies:  Allergies  Allergen Reactions  . Codeine     Patient stats she might have had an allergic reaction at 22 y/o, but then states she took it when older with a mild issue (nausea and vomiting)  . Latex Swelling    Swelling of lips and rash  . Lamictal [Lamotrigine] Rash    Metabolic Disorder Farrell: Lab Results  Component Value Date   HGBA1C 5.1 06/26/2016   Lab Results  Component Value Date   PROLACTIN 43.9 (H) 06/26/2016   Lab Results  Component Value Date   CHOL 220 (H) 06/26/2016   TRIG 102 06/26/2016   HDL 65 06/26/2016   CHOLHDL 3.4 06/26/2016   VLDL 20 06/26/2016   LDLCALC 135 (H) 06/26/2016   LDLCALC 153 (H) 10/30/2015   Lab Results  Component Value Date   TSH 3.386 06/26/2016    Therapeutic Level Farrell: No results found for: LITHIUM No results found for: VALPROATE No components found for:  CBMZ  Current Medications: Current Outpatient Medications  Medication Sig Dispense Refill  . hydrOXYzine (ATARAX/VISTARIL) 10 MG tablet Take 1-2 tablets (10-20 mg total) by mouth at bedtime as needed for anxiety (sleep). 180 tablet 0  . levonorgestrel (MIRENA) 20 MCG/24HR IUD 1 each by  Intrauterine route once.    . sertraline (ZOLOFT) 50 MG tablet Take 1 tablet (50 mg total) by mouth daily. 90 tablet 0  . QUEtiapine (SEROQUEL XR) 50 MG TB24 24 hr tablet Take 1 tablet (50 mg total) by mouth at bedtime. 90 tablet 1  . QUEtiapine (SEROQUEL XR) 50 MG TB24 24 hr tablet Take 1 tablet (50 mg total) by mouth at bedtime. 10 tablet 0   Current Facility-Administered Medications  Medication Dose Route Frequency Provider Last Rate Last Dose  .  prenatal multivitamin tablet 1 tablet  1 tablet Oral Q1200 Brandy Hale, MD         Musculoskeletal: Strength & Muscle Tone: within normal limits Gait & Station: normal Patient leans: N/A  Psychiatric Specialty Exam: Review of Systems  Psychiatric/Behavioral: The patient is nervous/anxious.   All other systems reviewed and are negative.   Blood pressure (!) 136/98, pulse 88, weight 139 lb 3.2 oz (63.1 kg), not currently breastfeeding.Body mass index is 23.89 kg/m.  General Appearance: Casual  Eye Contact:  Fair  Speech:  Clear and Coherent  Volume:  Normal  Mood:  Anxious  Affect:  Congruent  Thought Process:  Goal Directed and Descriptions of Associations: Intact  Orientation:  Full (Time, Place, and Person)  Thought Content: Logical   Suicidal Thoughts:  No  Homicidal Thoughts:  No  Memory:  Immediate;   Fair Recent;   Fair Remote;   Fair  Judgement:  Fair  Insight:  Fair  Psychomotor Activity:  Normal  Concentration:  Concentration: Fair and Attention Span: Fair  Recall:  [employer] of Knowledge: Fair  Language: Fair  Akathisia:  No  Handed:  Right  AIMS (if indicated): denies rigidity, stiffness   Assets:  Communication Skills Desire for Improvement Housing Social Support  ADL's:  Intact  Cognition: WNL  Sleep:  Fair   Screenings: AIMS     Admission (Discharged) from 06/25/2016 in [facility]INE  AIMS Total Score  0       Assessment and Plan: Colin Mulders is a 22 year old Caucasian female who  has a history of bipolar disorder, anxiety disorder, polysubstance abuse, noncompliant with medications in the past, presented to the clinic today for a follow-up visit.  Patient today reports her mood symptoms as stable.  She has started the Seroquel back, does struggle with some side effects.  Discussed medication readjustment with patient as noted below.  Plan For bipolar disorder likely substance-induced Continue Zoloft 50 mg p.o. daily. Change Seroquel to extended release 50 mg p.o. Nightly  For GAD Zoloft 50 mg p.o. daily Hydroxyzine 10-20 mg p.o. nightly as needed for anxiety symptoms  For insomnia Seroquel extended release 50 mg p.o. nightly  History of polysubstance abuse-patient continues to be sober.  Tobacco use disorder Provided smoking cessation counseling.  Follow-up in clinic in 2 months or sooner if needed.  More than 50 % of the time was spent for psychoeducation and supportive psychotherapy and care coordination.  This note was generated in part or whole with voice recognition software. Voice recognition is usually quite accurate but there are transcription errors that can and very often do occur. I apologize for any typographical errors that were not detected and corrected.         Jomarie Longs, MD 06/03/2018, 8:39 AM

## 2018-06-03 ENCOUNTER — Encounter: Payer: Self-pay | Admitting: Psychiatry

## 2018-06-04 ENCOUNTER — Ambulatory Visit: Payer: 59 | Admitting: Psychiatry

## 2018-06-19 ENCOUNTER — Telehealth: Payer: Self-pay | Admitting: Psychiatry

## 2018-06-19 NOTE — Telephone Encounter (Signed)
Attempted calling pt since seroquel ER was denied by her insurance. No response.

## 2018-06-22 ENCOUNTER — Telehealth: Payer: Self-pay | Admitting: Psychiatry

## 2018-06-22 MED ORDER — QUETIAPINE FUMARATE 50 MG PO TABS: 50.0000 mg | ORAL_TABLET | Freq: Every day | ORAL | 0 refills | Status: DC

## 2018-06-22 MED ORDER — QUETIAPINE FUMARATE 50 MG PO TABS: 50.0000 mg | ORAL_TABLET | Freq: Every day | ORAL | 1 refills | Status: DC

## 2018-06-22 NOTE — Telephone Encounter (Signed)
CALLED PT AND CHANGED HER SEROQUEL XR TO SEROQUEL SINCE NOT COVERED BY HER INSURANCE PLAN.

## 2018-07-06 ENCOUNTER — Other Ambulatory Visit: Payer: Self-pay | Admitting: Psychiatry

## 2018-07-09 ENCOUNTER — Ambulatory Visit: Payer: 59 | Admitting: Obstetrics and Gynecology

## 2018-07-12 ENCOUNTER — Other Ambulatory Visit: Payer: Self-pay | Admitting: Psychiatry

## 2018-07-12 DIAGNOSIS — F411 Generalized anxiety disorder: Secondary | ICD-10-CM

## 2018-07-27 ENCOUNTER — Telehealth: Payer: Self-pay | Admitting: Psychiatry

## 2018-07-27 MED ORDER — QUETIAPINE FUMARATE 50 MG PO TABS: ORAL_TABLET | ORAL | 1 refills | Status: DC

## 2018-07-27 NOTE — Telephone Encounter (Signed)
seroquel sent to pharmacy 

## 2018-07-28 ENCOUNTER — Ambulatory Visit: Payer: 59 | Admitting: Psychiatry

## 2018-09-21 ENCOUNTER — Other Ambulatory Visit: Payer: Self-pay

## 2018-09-21 ENCOUNTER — Emergency Department: Payer: No Typology Code available for payment source

## 2018-09-21 ENCOUNTER — Encounter: Payer: Self-pay | Admitting: Emergency Medicine

## 2018-09-21 ENCOUNTER — Emergency Department
Admission: EM | Admit: 2018-09-21 | Discharge: 2018-09-21 | Disposition: A | Discharge status: Home / Self Care | Payer: No Typology Code available for payment source | Attending: Emergency Medicine | Admitting: Emergency Medicine

## 2018-09-21 DIAGNOSIS — F17210 Nicotine dependence, cigarettes, uncomplicated: Secondary | ICD-10-CM | POA: Diagnosis not present

## 2018-09-21 DIAGNOSIS — Z79899 Other long term (current) drug therapy: Secondary | ICD-10-CM | POA: Insufficient documentation

## 2018-09-21 DIAGNOSIS — G44319 Acute post-traumatic headache, not intractable: Secondary | ICD-10-CM | POA: Diagnosis not present

## 2018-09-21 DIAGNOSIS — S0990XA Unspecified injury of head, initial encounter: Secondary | ICD-10-CM

## 2018-09-21 DIAGNOSIS — R51 Headache: Secondary | ICD-10-CM | POA: Diagnosis present

## 2018-09-21 LAB — POCT PREGNANCY, URINE: Preg Test, Ur: NEGATIVE

## 2018-09-21 MED ORDER — PROMETHAZINE HCL 25 MG/ML IJ SOLN
25.0000 mg | Freq: Once | INTRAMUSCULAR | Status: AC
  Administered 2018-09-21: 25 mg via INTRAMUSCULAR
  Filled 2018-09-21: qty 1

## 2018-09-21 MED ORDER — KETOROLAC TROMETHAMINE 30 MG/ML IJ SOLN
30.0000 mg | Freq: Once | INTRAMUSCULAR | Status: AC
  Administered 2018-09-21: 30 mg via INTRAMUSCULAR
  Filled 2018-09-21: qty 1

## 2018-09-21 MED ORDER — DIPHENHYDRAMINE HCL 50 MG/ML IJ SOLN
50.0000 mg | Freq: Once | INTRAMUSCULAR | Status: AC
  Administered 2018-09-21: 50 mg via INTRAMUSCULAR
  Filled 2018-09-21: qty 1

## 2018-09-21 MED ORDER — BUTALBITAL-APAP-CAFFEINE 50-325-40 MG PO TABS: 1.0000 | ORAL_TABLET | Freq: Four times a day (QID) | ORAL | 0 refills | Status: DC | PRN

## 2018-09-21 NOTE — ED Provider Notes (Signed)
[place] [facility] Emergency Department Provider Note  ____________________________________________  Time seen: Approximately 5:38 PM  I have reviewed the triage vital signs and the nursing notes.   HISTORY  Chief Complaint Head Injury    HPI Holly Farrell is a 22 y.o. female who presents the emergency department complaining of headache, dizziness, nausea x2 days.  Patient was at work, accidentally hit her head on a metal shelf.  Patient reports that this was low impact that she was bending over.  Patient reports that the initial injury caused her head hurt, her to become dizzy.  Patient went to the staff area, had episodes of dry heaving without emesis.  She experienced ongoing headache and dizziness to the point she had to go home from work.  Patient has experienced ongoing left-sided headache, dizziness, nausea.  She has had no loss of consciousness at either the time of injury or subsequently.  She denies any bruising to the face or head.  No lacerations are reported.  Patient has tried limiting screen time, taking anti-inflammatories, sleeping as much as possible and concerned that this may have had a concussion.  These tactics are not improving symptoms.  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  Patient had a large TV fall, strike her in the right side of the head.  Patient subsequently experienced vital signs of the right ear, serosanguineous fluid drainage from the ear and the area on the right side.  Patient was evaluated and diagnosed with a skull fracture and traumatic brain injury.  Patient endorses intermittent ongoing symptoms to include permanent hearing loss right side, inability to produce tears to the right eye as well as intermittent headaches, dizziness.  Patient reports no changes since recent head injury.  Patient also has a history of anxiety, bipolar disorder, depression.  No complaints with chronic medical problems at this time.     Past Medical History:  Diagnosis Date  . Anxiety   . Bipolar disorder (HCC)   . Depression   . Family history of ovarian cancer     Patient Active Problem List   Diagnosis Date Noted  . History of third degree perineal laceration 01/17/2018  . Hemorrhoid 01/16/2018  . Acne 01/28/2017  . Anxiety 01/28/2017  . Eczema 01/28/2017  . GERD (gastroesophageal reflux disease) 01/28/2017  . Chlamydia infection affecting pregnancy in first trimester 12/02/2016  . Marijuana use 12/02/2016  . Cannabis use disorder, moderate, dependence (HCC) 06/26/2016  . 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 Bipolar II disorder (HCC) 06/25/2016  . Suicide attempt by drug ingestion (HCC) 06/25/2016  . Tobacco use disorder 06/25/2016  . History of head injury 09/23/2014  . Mania (monopolar) single episode or unspecified 09/23/2014  . Acute drug intoxication with perceptual disturbance (HCC) 09/13/2014    Past Surgical History:  Procedure Laterality Date  . 3rd degree laceration  03/2017  . TONSILLECTOMY      Prior to Admission medications   Medication Sig Start Date End Date Taking? Authorizing Provider  butalbital-acetaminophen-caffeine (FIORICET, ESGIC) [phone] MG tablet Take 1-2 tablets by mouth every 6 (six) hours as needed for headache. 09/21/18 09/21/19  Cuthriell, Delorise Royals, PA-C  hydrOXYzine (ATARAX/VISTARIL) 10 MG tablet Take 1-2 tablets (10-20 mg total) by mouth at bedtime as needed for anxiety (sleep). 05/19/18   Jomarie Longs, MD  levonorgestrel (MIRENA) 20 MCG/24HR IUD 1 each by Intrauterine route once.    [provider]  QUEtiapine (  SEROQUEL) 50 MG tablet Take 1 tablet (50 mg total) by mouth at bedtime. 06/22/18   Jomarie Longs, MD  QUEtiapine (SEROQUEL) 50 MG tablet TAKE 1 TABLET BY MOUTH EVERYDAY AT BEDTIME 07/27/18   Eappen, Levin Bacon, MD  sertraline (ZOLOFT) 50 MG tablet TAKE 1 TABLET BY MOUTH  DAILY 07/27/18   Jomarie Longs, MD     Allergies Codeine; Latex; and Lamictal [lamotrigine]  Family History  Problem Relation Age of Onset  . Depression Mother   . Anxiety disorder Mother   . Bipolar disorder Mother   . Hypertension Mother   . Asthma Father   . Alcohol abuse Father   . Sleep disorder Brother   . Hypertension Maternal Grandmother   . Bipolar disorder Maternal Grandfather   . Ovarian cancer Paternal Grandmother     Social History Social History   Tobacco Use  . Smoking status: Current Every Day Smoker    Packs/day: 0.50    Types: Cigarettes    Start date: 10/03/2014  . Smokeless tobacco: Never Used  Substance Use Topics  . Alcohol use: Yes    Alcohol/week: 0.0 - 12.0 standard drinks  . Drug use: Yes    Types: Marijuana, Cocaine    Comment: marjuana last used yesterday cocaine last used 12-24-15 also uses Xanax     Review of Systems  Constitutional: No fever/chills Eyes: No visual changes. No discharge ENT: No upper respiratory complaints. Cardiovascular: no chest pain. Respiratory: no cough. No SOB. Gastrointestinal: No abdominal pain.  Positive for nausea but no vomiting.  No diarrhea.  No constipation. Musculoskeletal: Negative for musculoskeletal pain. Skin: Negative for rash, abrasions, lacerations, ecchymosis. Neurological: Positive for headache and dizziness but denies focal weakness or numbness. 10-point ROS otherwise negative.  ____________________________________________   PHYSICAL EXAM:  VITAL SIGNS: ED Triage Vitals  Enc Vitals Group     BP 09/21/18 1627 126/84     Pulse Rate 09/21/18 1627 73     Resp 09/21/18 1627 18     Temp 09/21/18 1627 98.4 F (36.9 C)     Temp Source 09/21/18 1627 Oral     SpO2 09/21/18 1627 100 %     Weight 09/21/18 1627 145 lb (65.8 kg)     Height 09/21/18 1627 5\' 4"  (1.626 m)     Head Circumference --      Peak Flow --      Pain Score 09/21/18 1703 6     Pain Loc --      Pain Edu? --      Excl. in GC? --      Constitutional:  Alert and oriented. Well appearing and in no acute distress. Eyes: Conjunctivae are normal. PERRL. EOMI. Head: Atraumatic.  No visible signs of trauma with ecchymosis, abrasions, lacerations.  Patient is nontender to palpation over the osseous structures of the skull and face.  No battle signs, raccoon eyes, serosanguineous fluid drainage from the ears or nares. ENT:      Ears:       Nose: No congestion/rhinnorhea.      Mouth/Throat: Mucous membranes are moist.  Neck: No stridor.  No cervical spine tenderness to palpation  Cardiovascular: Normal rate, regular rhythm. Normal S1 and S2.  Good peripheral circulation. Respiratory: Normal respiratory effort without tachypnea or retractions. Lungs CTAB. Good air entry to the bases with no decreased or absent breath sounds. Musculoskeletal: Full range of motion to all extremities. No gross deformities appreciated. Neurologic:  Normal speech and language. No gross focal neurologic deficits  are appreciated.  Cranial nerves II through XII grossly intact.  Negative Romberg's and pronator drift. Skin:  Skin is warm, dry and intact. No rash noted. Psychiatric: Mood and affect are normal. Speech and behavior are normal. Patient exhibits appropriate insight and judgement.   ____________________________________________   LABS (all labs ordered are listed, but only abnormal results are displayed)  Labs Reviewed  POC URINE PREG, ED  POCT PREGNANCY, URINE   ____________________________________________  EKG   ____________________________________________  RADIOLOGY I personally viewed and evaluated these images as part of my medical decision making, as well as reviewing the written report by the radiologist.  Ct Head Wo Contrast  Result Date: 09/21/2018 CLINICAL DATA:  Headache EXAM: CT HEAD WITHOUT CONTRAST TECHNIQUE: Contiguous axial images were obtained from the base of the skull through the vertex without intravenous contrast. COMPARISON:  None.  FINDINGS: Brain: No evidence of acute infarction, hemorrhage, hydrocephalus, extra-axial collection or mass lesion/mass effect. Vascular: No hyperdense vessel or unexpected calcification. Skull: Normal. Negative for fracture or focal lesion. Sinuses/Orbits: No acute finding. Other: None IMPRESSION: Negative non contrasted CT appearance of the brain. Electronically Signed   By: Jasmine Pang M.D.   On: 09/21/2018 18:07    ____________________________________________    PROCEDURES  Procedure(s) performed:    Procedures    [place] CT Head Rule   CT head is recommended if yes to ANY of the following:   Major Criteria ("high risk" for an injury requiring neurosurgical intervention, sensitivity 100%):   No.   GCS < 15 at 2 hours post-injury No.   Suspected open or depressed skull fracture No.   Any sign of basilar skull fracture? (Hemotympanum, racoon eyes, battle's sign, CSF oto/rhinorrhea) No.   ? 2 episodes of vomiting No.   Age ? 65   Minor Criteria ("medium" risk for an intracranial traumatic finding, sensitivity 83-100%):   No.   Retrograde Amnesia to the Event ? 30 minutes No.   "Dangerous" Mechanism? (Pedestrian struck by motor vehicle, occupant ejected from motor vehicle, fall from >3 ft or >5 stairs.)   According to [place] CT head rules, patient has no major or minor criteria.  Cranial nerve testing was reassuring.  However, patient has a history of traumatic brain injury at the age of 3 with ongoing symptoms from same.  Patient experiences permanent right-sided hearing loss, intermittent migraines, inability of right eye to produce tears.  Based on my evaluation of the patient, including application of this decision instrument, as well as patient's previous history of traumatic brain injury, CT head to evaluate for traumatic intracranial injury is indicated at this time. I have discussed this recommendation with the patient who states understanding and agreement with this  plan.    Medications  promethazine (PHENERGAN) injection 25 mg (has no administration in time range)  ketorolac (TORADOL) 30 MG/ML injection 30 mg (has no administration in time range)  diphenhydrAMINE (BENADRYL) injection 50 mg (has no administration in time range)     ____________________________________________   INITIAL IMPRESSION / ASSESSMENT AND PLAN / ED COURSE  Pertinent labs & imaging results that were available during my care of the patient were reviewed by me and considered in my medical decision making (see chart for details).  Review of the [place] CSRS was performed in accordance of the [facility] prior to dispensing any controlled drugs.  Clinical Course as of Sep 22 1911  Mon Sep 21, 2018  1751 Patient presented to the emergency department with ongoing headache, dizziness, nausea after head  injury.  Head injury was minor, no loss of consciousness.  Patient has had nausea with dry heaves but no emesis.  Exam was reassuring with no visible signs of trauma.  Patient was negative for major or minor criteria and [place] CT head rule.  Patient was neurologically intact on exam.  However, patient does have a history of skull fracture and traumatic brain injury from age 84 with residual effects.  Given patient's history, ongoing symptoms even with a reassuring exam, I feel CT head is appropriate.  Patient will be evaluated with CT scan.  Patient agrees with imaging decision at this time.   [JC]    Clinical Course User Index [JC] Cuthriell, Delorise Royals, PA-C     Patient's diagnosis is consistent with head injury resulting in posttraumatic headache.  Patient presented to the emergency department with ongoing headache after head injury.  Head injury was relatively minor in nature.  Neuro exam is reassuring.  Patient does have a history of traumatic brain injury with residual deficits.  As such, CT scan was ordered.  This returns with reassuring results with no acute intracranial hemorrhage or  osseous abnormality.  Patient was given migraine cocktail for symptomatic relief of the headache.. Patient will be discharged home with prescriptions for Fioricet to be taken if headache persists.. Patient is to follow up with primary care as needed or otherwise directed. Patient is given ED precautions to return to the ED for any worsening or new symptoms.     ____________________________________________  FINAL CLINICAL IMPRESSION(S) / ED DIAGNOSES  Final diagnoses:  Injury of head, initial encounter  Acute post-traumatic headache, not intractable      NEW MEDICATIONS STARTED DURING THIS VISIT:  ED Discharge Orders         Ordered    butalbital-acetaminophen-caffeine (FIORICET, ESGIC) 50-325-40 MG tablet  Every 6 hours PRN     09/21/18 1902              This chart was dictated using voice recognition software/Dragon. Despite best efforts to proofread, errors can occur which can change the meaning. Any change was purely unintentional.    Racheal Patches, PA-C 09/21/18 1913    Myrna Blazer, MD 09/22/18 [phone]

## 2018-09-21 NOTE — ED Notes (Signed)
Pt neurologically intact in triage, ambulatory without difficulty.

## 2018-09-21 NOTE — ED Triage Notes (Signed)
Pt arrived via POV with reports of head injury on Saturday at work, pt states she has a dull migraine like headache since then.   Pt denies any LOC when she hit her forehead on Saturday. Pt states she hit her head on a metal shelf.

## 2019-02-22 LAB — HM HEPATITIS C SCREENING LAB: HM Hepatitis Screen: NEGATIVE

## 2019-02-22 LAB — HM HIV SCREENING LAB: HM HIV Screening: NEGATIVE

## 2019-05-24 ENCOUNTER — Ambulatory Visit: Payer: Self-pay | Admitting: *Deleted

## 2019-05-24 NOTE — Telephone Encounter (Signed)
Pt had requested to be tested when her daughter was tested for covid-19. Advised to do an evisit to get set up with testing. Pt voiced understanding.

## 2019-07-26 ENCOUNTER — Ambulatory Visit: Payer: 59

## 2019-07-26 ENCOUNTER — Other Ambulatory Visit: Payer: Self-pay

## 2019-07-26 DIAGNOSIS — Z113 Encounter for screening for infections with a predominantly sexual mode of transmission: Secondary | ICD-10-CM

## 2019-07-26 LAB — WET PREP FOR TRICH, YEAST, CLUE
Trichomonas Exam: NEGATIVE
Yeast Exam: NEGATIVE

## 2019-07-26 NOTE — Progress Notes (Signed)
Wet mount reviewed, no tx per standing order. Provider orders completed. 

## 2019-07-26 NOTE — Progress Notes (Signed)
    STI clinic/screening visit  Subjective:  Holly Farrell is a 23 y.o. female being seen today for an STI screening visit. The patient reports they do not have symptoms.  Patient has the following medical conditions:   Patient Active Problem List   Diagnosis Date Noted  . Anxiety 01/28/2017  . Eczema 01/28/2017  . Cannabis use disorder, moderate, dependence ([place]) 06/26/2016  . Tobacco use disorder 06/25/2016     Chief Complaint  Patient presents with  . SEXUALLY TRANSMITTED DISEASE    STD check    HPI  Patient reports here for STD screen.  She has no sympts.  See flowsheet for further details and programmatic requirements.    The following portions of the patient's history were reviewed and updated as appropriate: allergies, current medications, past medical history, past social history, past surgical history and problem list.  Objective:  There were no vitals filed for this visit.  Physical Exam HENT:     Mouth/Throat:     Mouth: Mucous membranes are moist.     Pharynx: Oropharynx is clear. No oropharyngeal exudate or posterior oropharyngeal erythema.  Neck:     Musculoskeletal: Neck supple. No muscular tenderness.  Genitourinary:    General: Normal vulva.     Vagina: Vaginal discharge present.     Comments: iud strings noted;  Thick, curdy white/yellow disch, no odor, ph <4.5 Lymphadenopathy:     Cervical: No cervical adenopathy.  Skin:    General: Skin is warm and dry.     Findings: No lesion or rash.  Neurological:     Mental Status: She is alert.  Psychiatric:        Mood and Affect: Mood normal.    Assessment and Plan:  Holly Farrell is a 23 y.o. female presenting to the [facility] Department for STI screening  1. Screening examination for venereal disease - WET PREP FOR TRICH, YEAST, CLUE - Chlamydia/Gonorrhea [facility] - HIV/HCV [facility] - Syphilis Serology, [facility] Treat wet prep as per SO. Co. To use condoms for  STD prevention.  Offered condoms, client declined.   No follow-ups on file.  No future appointments.  Hassell Done, FNP

## 2019-11-06 IMAGING — CT CT HEAD W/O CM
3 series · 15 of 47 positions shown, 18 images · non-contrast
Comparison: None.

CLINICAL DATA: Headache

EXAM:
CT HEAD WITHOUT CONTRAST
TECHNIQUE: Contiguous axial images were obtained from the base of the skull
through the vertex without intravenous contrast.

[Series 2: head wo · axial · 0.47mm/px · z∈[-149,-24]mm · 9 of 31 slices shown, 12 images]
[im 3/31  brain]
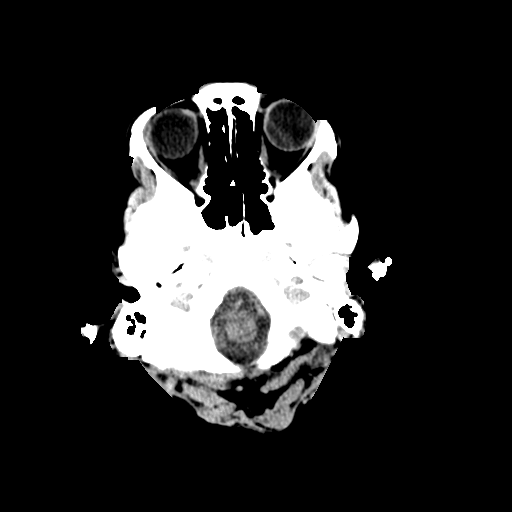
[im 3/31  bone]
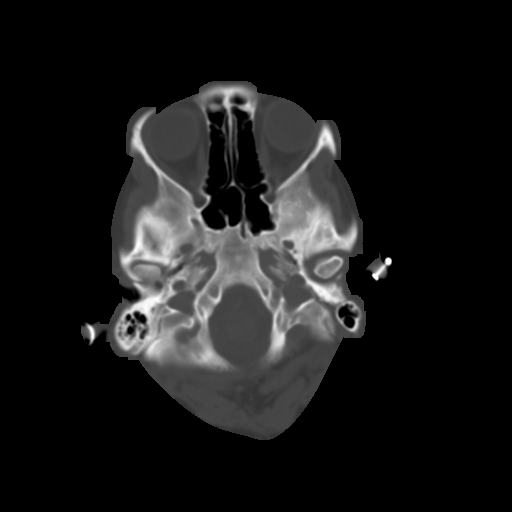
[im 6/31  brain]
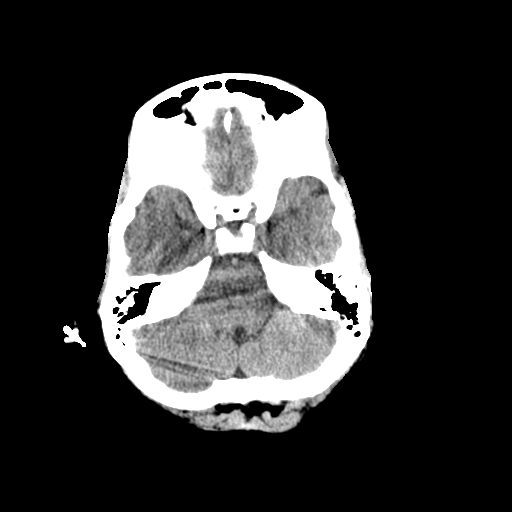
[im 9/31  brain]
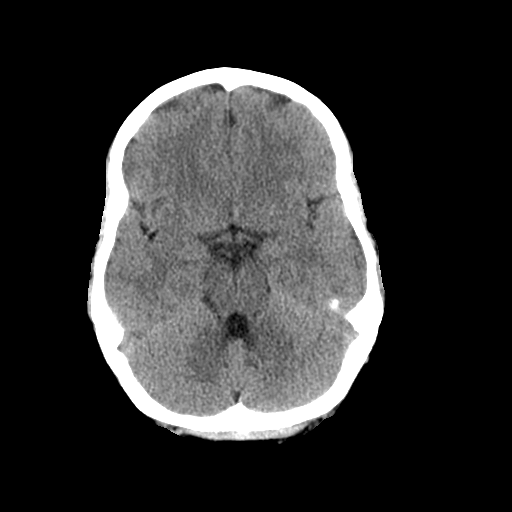
[im 12/31  brain]
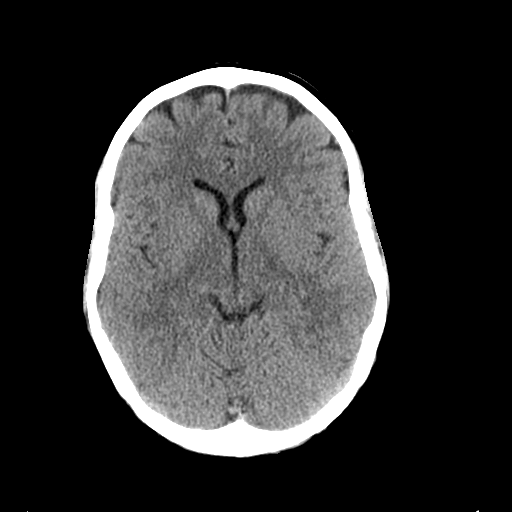
[im 16/31  brain]
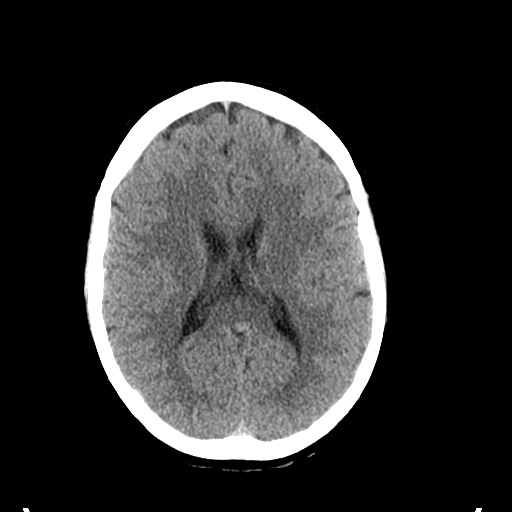
[im 16/31  bone]
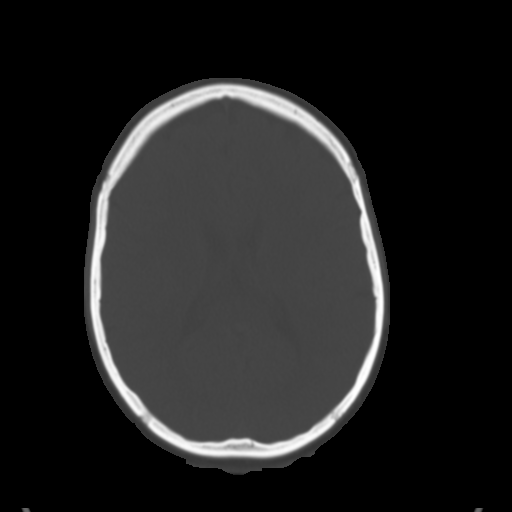
[im 19/31  brain]
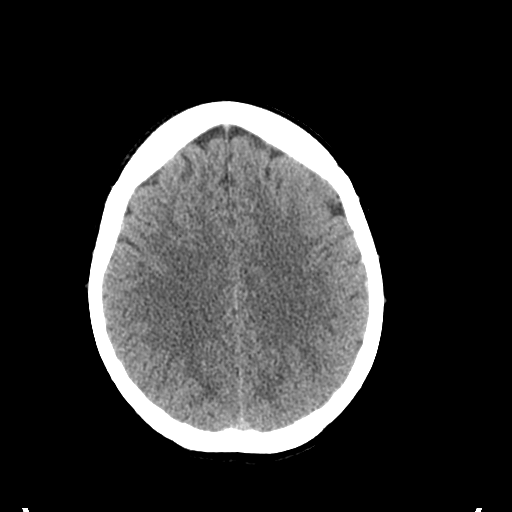
[im 22/31  brain]
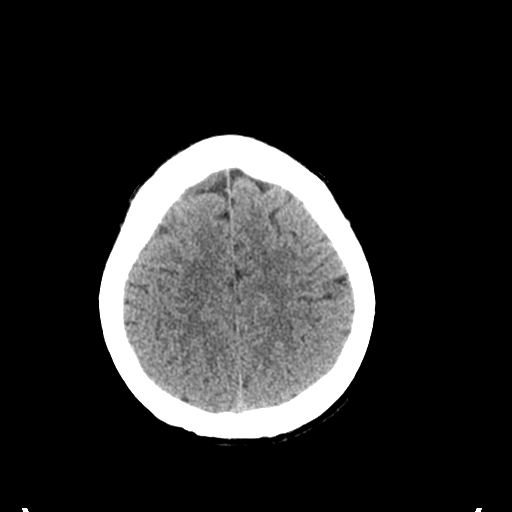
[im 25/31  brain]
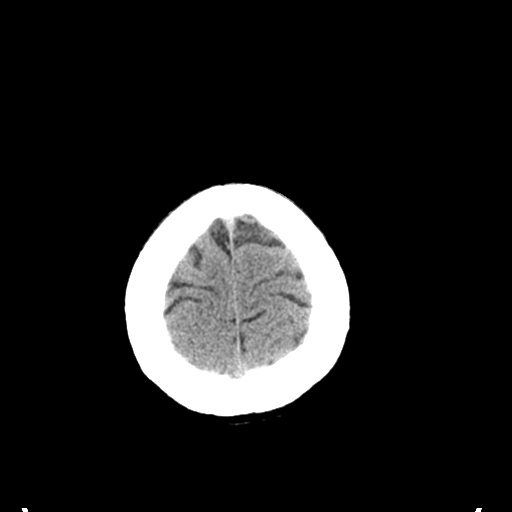
[im 28/31  brain]
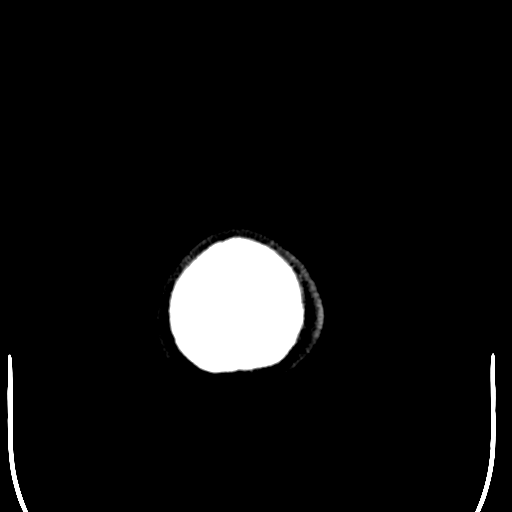
[im 28/31  bone]
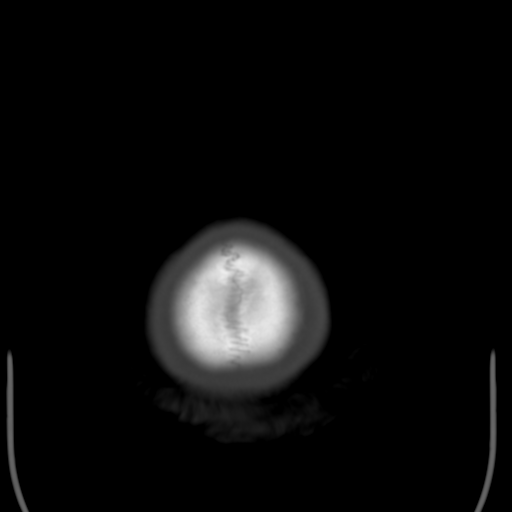

[Series 4: coronal soft tissue · coronal · 0.29mm/px · 3 of 66 slices shown]
[im 22/66  brain]
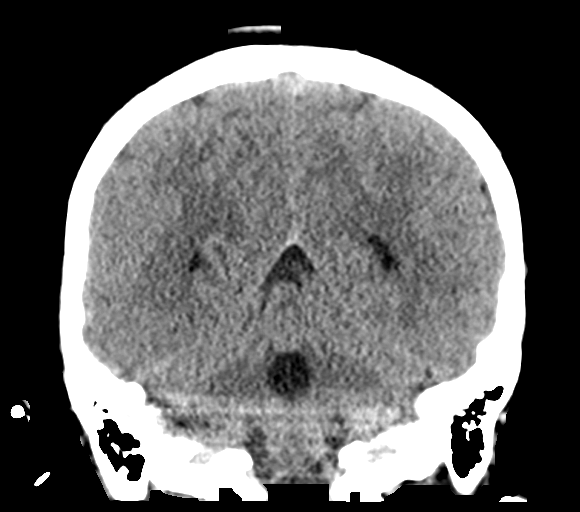
[im 29/66  brain]
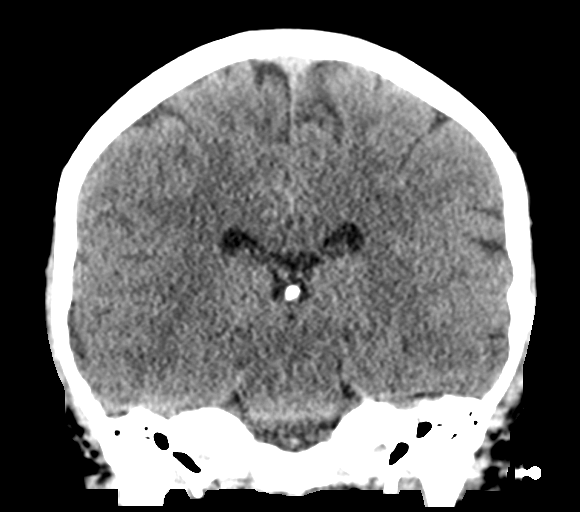
[im 37/66  brain]
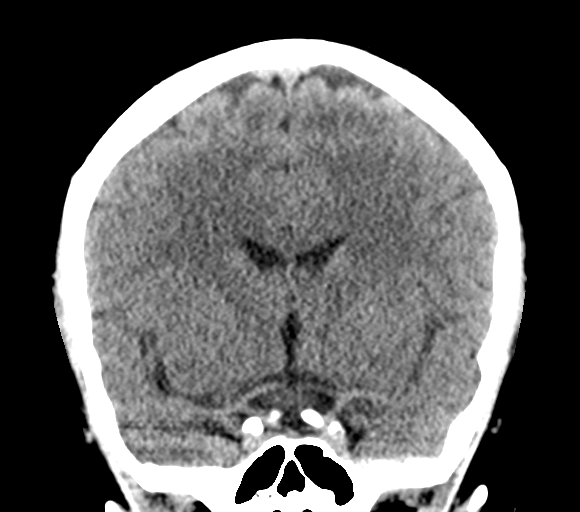

[Series 5: sagittal soft tissue · sagittal · 0.28mm/px · 3 of 53 slices shown]
[im 18/53  brain]
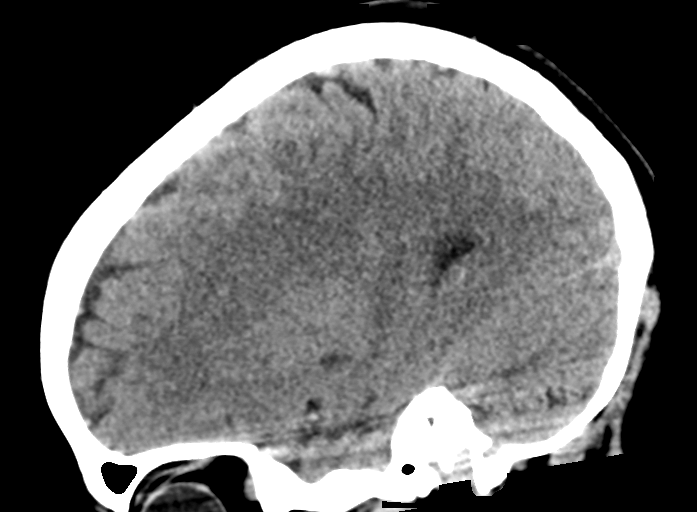
[im 27/53  brain]
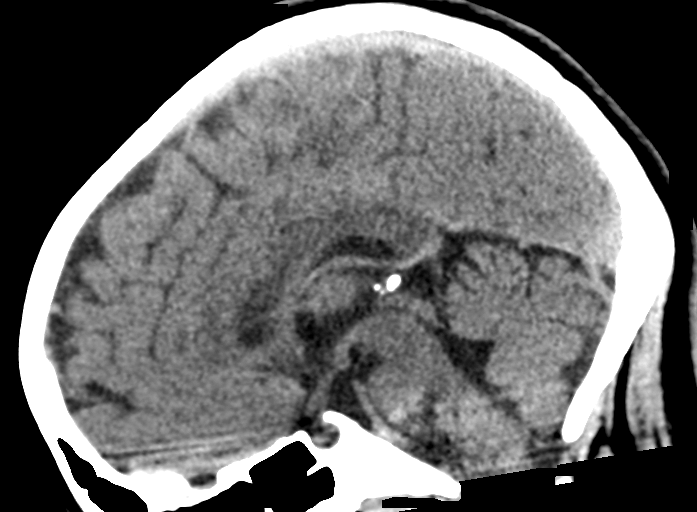
[im 35/53  brain]
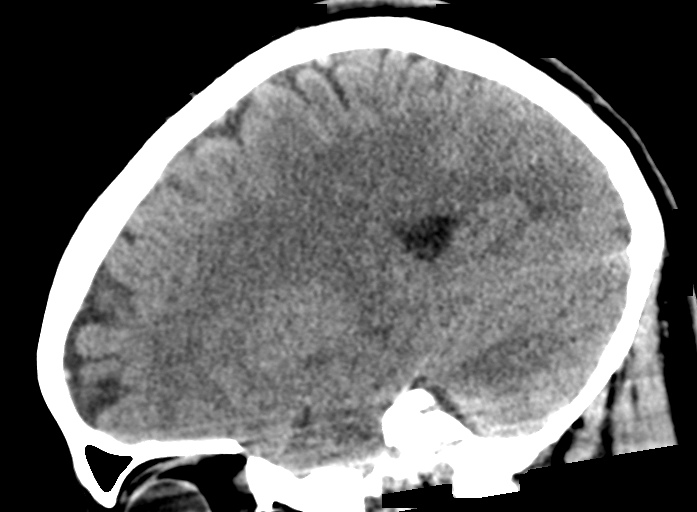

[15 of 47 positions shown; findings below may reference images not displayed]

FINDINGS: Brain: No evidence of acute infarction, hemorrhage, hydrocephalus,
extra-axial collection or mass lesion/mass effect.

Vascular: No hyperdense vessel or unexpected calcification.

Skull: Normal. Negative for fracture or focal lesion.

Sinuses/Orbits: No acute finding.

Other: None
IMPRESSION: Negative non contrasted CT appearance of the brain.

## 2019-12-14 ENCOUNTER — Other Ambulatory Visit: Payer: Self-pay

## 2019-12-14 ENCOUNTER — Ambulatory Visit: Payer: 59 | Admitting: Family Medicine

## 2019-12-14 ENCOUNTER — Encounter: Payer: Self-pay | Admitting: Family Medicine

## 2019-12-14 VITALS — BP 112/73 | Ht 63.0 in | Wt 129.0 lb

## 2019-12-14 DIAGNOSIS — Z3009 Encounter for other general counseling and advice on contraception: Secondary | ICD-10-CM

## 2019-12-14 DIAGNOSIS — R1030 Lower abdominal pain, unspecified: Secondary | ICD-10-CM

## 2019-12-14 DIAGNOSIS — Z113 Encounter for screening for infections with a predominantly sexual mode of transmission: Secondary | ICD-10-CM

## 2019-12-14 LAB — WET PREP FOR TRICH, YEAST, CLUE
Trichomonas Exam: NEGATIVE
Yeast Exam: NEGATIVE

## 2019-12-14 NOTE — Progress Notes (Signed)
[employer] results reviewed. Per standing order no treatment indicated. Reviewed with patient provider recommendation to follow up with PCP for ongoing or worsening symptoms. Patient states she is leaving to go to PCP now. Hal Morales, RN

## 2019-12-14 NOTE — Progress Notes (Signed)
Patient here for STD testing and questions about IUD. States she is having some spotting and has had some symptoms since last sex about 2 weeks ago. C/O painful urination and urinary frequency. Wants IUD checked.Jenetta Downer, RN

## 2019-12-14 NOTE — Progress Notes (Signed)
[facility] problem visit  [place] Department  Subjective:  STEPAHNIE Farrell is a 23 y.o. being seen today for   Chief Complaint  Patient presents with  . Abdominal Pain    HPI  Pt is here for abd pain, pain with urination, STI screening and mirena string check.   She has a new partner x2 wks, no condom use. Has had abdominal pain x2 wks. Describes as tender, over low abdomen and low back. Last sex 6 days ago, maybe a little painful. Pain with urination began 10-12 days ago, present every time she urinates, also has had increased urination. Has had abnormal vaginal discharge x1 wk, foul odor.   She has mirena IUD, placed Jan 2019. She gets periods every 1-3 months, light spotting, minimal cramping. Last spotting was 1 month ago, thinks she may be starting period now, has had some spotting.   Does the patient have a current or past history of drug use? Yes   No components found for: HCV]   Health Maintenance Due  Topic Date Due  . CHLAMYDIA SCREENING  01/19/2011  . PAP-Cervical Cytology Screening  01/19/2017  . PAP SMEAR-Modifier  01/19/2017  . INFLUENZA VACCINE  07/24/2019    ROS  The following portions of the patient's history were reviewed and updated as appropriate: allergies, current medications, past family history, past medical history, past social history, past surgical history and problem list. Problem list updated.   See flowsheet for other program required questions.  Objective:   Vitals:   12/14/19 0944  BP: 112/73  Weight: 129 lb (58.5 kg)  Height: 5\' 3"  (1.6 m)    Physical Exam Vitals and nursing note reviewed.  Constitutional:      Appearance: Normal appearance.  HENT:     Head: Normocephalic and atraumatic.     Mouth/Throat:     Mouth: Mucous membranes are moist.     Pharynx: Oropharynx is clear. No oropharyngeal exudate or posterior oropharyngeal erythema.  Pulmonary:     Effort: Pulmonary effort is normal.   Abdominal:     General: Abdomen is flat.     Palpations: There is no mass.     Tenderness: There is abdominal tenderness (mild) in the left lower quadrant. There is no right CVA tenderness, left CVA tenderness, guarding or rebound.  Genitourinary:    General: Normal vulva.     Exam position: Lithotomy position.     Pubic Area: No rash or pubic lice.      Labia:        Right: No rash or lesion.        Left: No rash or lesion.      Vagina: Bleeding (deep red blood in vault) present. No vaginal discharge, erythema or lesions.     Cervix: No cervical motion tenderness, discharge, friability, lesion or erythema.     Uterus: Normal.      Adnexa: Right adnexa normal and left adnexa normal.     Rectum: Normal.     Comments: 2 IUD strings visualize, ~3cm in length Lymphadenopathy:     Head:     Right side of head: No preauricular or posterior auricular adenopathy.     Left side of head: No preauricular or posterior auricular adenopathy.     Cervical: No cervical adenopathy.     Upper Body:     Right upper body: No supraclavicular or axillary adenopathy.     Left upper body: No supraclavicular or axillary adenopathy.  Lower Body: No right inguinal adenopathy. No left inguinal adenopathy.  Skin:    General: Skin is warm and dry.     Findings: No rash.  Neurological:     Mental Status: She is alert and oriented to person, place, and time.       Assessment and Plan:  Holly Farrell is a 23 y.o. female presenting to the [facility] Department for a Women's Health problem visit  1. Screening examination for venereal disease -Screenings today as below. Treat wet prep per standing order -Patient does meet criteria for HepB, HepC Screening. Accepts these screenings. -Counseled on warning s/sx and when to seek care. Recommended condom use with all sex and discussed importance of condom use for STI prevention.  - WET PREP FOR TRICH, YEAST, CLUE - HBV Antigen/Antibody State  Lab - HIV/HCV [facility] - Syphilis Serology, [place] - Chlamydia/Gonorrhea [facility]  2. Lower abdominal pain Exam with mild tenderness LLQ abdomen, pelvic exam normal. STI screenings as above, but d/t urinary symptoms also advised further investigation for UTI with PCP, handout of providers given.   3. Family planning services IUD strings visualized.   Return for screening as needed.  No future appointments.  Ann Held, PA-C

## 2019-12-23 LAB — HM HIV SCREENING LAB: HM HIV Screening: NEGATIVE

## 2019-12-23 LAB — HM HEPATITIS C SCREENING LAB: HM Hepatitis Screen: NEGATIVE

## 2019-12-23 LAB — HEPATITIS B SURFACE ANTIGEN

## 2020-12-23 HISTORY — PX: WISDOM TOOTH EXTRACTION: SHX21

## 2021-04-27 ENCOUNTER — Ambulatory Visit (INDEPENDENT_AMBULATORY_CARE_PROVIDER_SITE_OTHER): Payer: BC Managed Care – PPO | Admitting: Obstetrics

## 2021-04-27 ENCOUNTER — Other Ambulatory Visit (HOSPITAL_COMMUNITY)
Admission: RE | Admit: 2021-04-27 | Discharge: 2021-04-27 | Disposition: A | Discharge status: Home / Self Care | Payer: Self-pay | Source: Ambulatory Visit | Attending: Obstetrics and Gynecology | Admitting: Obstetrics and Gynecology

## 2021-04-27 ENCOUNTER — Other Ambulatory Visit: Payer: Self-pay

## 2021-04-27 ENCOUNTER — Encounter: Payer: BC Managed Care – PPO | Admitting: Obstetrics and Gynecology

## 2021-04-27 ENCOUNTER — Encounter: Payer: Self-pay | Admitting: Obstetrics

## 2021-04-27 VITALS — BP 110/70 | Ht 64.0 in | Wt 131.8 lb

## 2021-04-27 DIAGNOSIS — Z30432 Encounter for removal of intrauterine contraceptive device: Secondary | ICD-10-CM | POA: Diagnosis not present

## 2021-04-27 DIAGNOSIS — Z124 Encounter for screening for malignant neoplasm of cervix: Secondary | ICD-10-CM | POA: Diagnosis not present

## 2021-04-27 DIAGNOSIS — Z113 Encounter for screening for infections with a predominantly sexual mode of transmission: Secondary | ICD-10-CM

## 2021-04-27 DIAGNOSIS — Z01419 Encounter for gynecological examination (general) (routine) without abnormal findings: Secondary | ICD-10-CM

## 2021-04-27 NOTE — Progress Notes (Signed)
1 
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
## 2021-05-01 LAB — CYTOLOGY - PAP
Chlamydia: NEGATIVE
Comment: NEGATIVE
Comment: NEGATIVE
Comment: NORMAL
Diagnosis: NEGATIVE
Neisseria Gonorrhea: NEGATIVE
Trichomonas: NEGATIVE

## 2021-05-02 ENCOUNTER — Other Ambulatory Visit: Payer: Self-pay | Admitting: Physician Assistant

## 2021-05-02 DIAGNOSIS — R200 Anesthesia of skin: Secondary | ICD-10-CM

## 2021-05-02 DIAGNOSIS — R202 Paresthesia of skin: Secondary | ICD-10-CM

## 2021-05-02 DIAGNOSIS — R519 Headache, unspecified: Secondary | ICD-10-CM

## 2021-05-09 ENCOUNTER — Encounter: Payer: Self-pay | Admitting: Obstetrics

## 2021-05-17 ENCOUNTER — Ambulatory Visit
Admission: RE | Admit: 2021-05-17 | Discharge: 2021-05-17 | Disposition: A | Discharge status: Home / Self Care | Payer: BC Managed Care – PPO | Source: Ambulatory Visit | Attending: Physician Assistant | Admitting: Physician Assistant

## 2021-05-17 ENCOUNTER — Other Ambulatory Visit: Payer: Self-pay

## 2021-05-17 DIAGNOSIS — R200 Anesthesia of skin: Secondary | ICD-10-CM

## 2021-05-17 DIAGNOSIS — R519 Headache, unspecified: Secondary | ICD-10-CM

## 2021-05-17 DIAGNOSIS — R202 Paresthesia of skin: Secondary | ICD-10-CM

## 2021-09-22 ENCOUNTER — Encounter: Payer: Self-pay | Admitting: Emergency Medicine

## 2021-09-22 ENCOUNTER — Ambulatory Visit
Admission: EM | Admit: 2021-09-22 | Discharge: 2021-09-22 | Disposition: A | Discharge status: Home / Self Care | Payer: BC Managed Care – PPO

## 2021-09-22 ENCOUNTER — Other Ambulatory Visit: Payer: Self-pay

## 2021-09-22 DIAGNOSIS — S46812A Strain of other muscles, fascia and tendons at shoulder and upper arm level, left arm, initial encounter: Secondary | ICD-10-CM | POA: Diagnosis not present

## 2021-09-22 MED ORDER — CYCLOBENZAPRINE HCL 10 MG PO TABS: 10.0000 mg | ORAL_TABLET | Freq: Two times a day (BID) | ORAL | 0 refills | Status: DC | PRN
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
## 2021-11-12 ENCOUNTER — Ambulatory Visit: Payer: Self-pay

## 2022-04-16 ENCOUNTER — Telehealth: Payer: Self-pay

## 2022-04-16 NOTE — Telephone Encounter (Signed)
Pt called triage leaving a message for a 2nd option, she had an abortion on 4/12 took the first pill at the clinic and the next 4 the next day. Did her f/up at the clinic and still had a positive pregnancy test and u/s still showed embryo. Dr there advised her to take another round of pills. Pt wanted a referral for a D&C, I advised her that the providers here do not do abortions so we could not advise on the D&C or \\another  round of the abortion pill. She should reach out to the clinic. ?

## 2022-05-03 ENCOUNTER — Ambulatory Visit (INDEPENDENT_AMBULATORY_CARE_PROVIDER_SITE_OTHER): Payer: 59 | Admitting: Licensed Practical Nurse

## 2022-05-03 DIAGNOSIS — O039 Complete or unspecified spontaneous abortion without complication: Secondary | ICD-10-CM

## 2022-05-05 NOTE — Progress Notes (Signed)
S) Pt had an abortion in April (did not state how far along she was).  She was given a pill on 4/12, went back to the clinic on 4/24 and was given another pill. She did bleed and pass some tissue. During her follow up visit on May 5 she had a positive UPT, pt states an [place] was done but she felt the examiner did not really look, was told "I can barely find the uterus, everything is fine" pt requested to be revaluated by another provider, eventually another provider saw her and told her "everything is fine".  Loryn is here today because she wants to be sure the abortion is complete.  She would like a pregnancy test, a pelvic exam,  and [place].  She understand she can have circulating pregnancy hormones for up to 6 to 8 week after an abortion.  ? ?Neita Garnet was using an IUD for contraception, out of concern for her health (she has been on a health make over) she had it removed. She would prefer a non hormonal method for contraception ? ?O) ?BP 118/62   Ht 5\' 4"  (1.626 m)   Wt 155 lb (70.3 kg)   BMI 26.61 kg/m?  ? ?Gen: NAD, well groomed ?Abd: soft non-tender  ?Genitalia:  ?Vulva WNL ?SSE: cervix pink, slightly open, no lesions  ?Bimanual exam: non tender, non gravid uterus, adnexa non tender no masses  ? ?A) s/p abortion, stable  ? ?P) ?-will do BHCG on Monday (lab tech not available and pt unable to get to [facility]) Wednesday ordered, will do if needed  ?-H.O. for ParaGard and effexi given  ?-has annual with [place], will reveiw contraception with her.  ?

## 2022-05-06 ENCOUNTER — Encounter: Payer: Self-pay | Admitting: Licensed Practical Nurse

## 2022-05-06 ENCOUNTER — Other Ambulatory Visit: Payer: 59

## 2022-05-06 DIAGNOSIS — O039 Complete or unspecified spontaneous abortion without complication: Secondary | ICD-10-CM

## 2022-05-07 LAB — BETA HCG QUANT (REF LAB): hCG Quant: 344 m[IU]/mL

## 2022-05-10 ENCOUNTER — Other Ambulatory Visit: Payer: Self-pay | Admitting: Licensed Practical Nurse

## 2022-05-10 NOTE — Progress Notes (Signed)
Order placed for repeat BHCG in 1-2 weeks following TAB in April Holly Farrell, CNM  [place], [facility]  05/10/22  8:16 AM

## 2022-05-31 ENCOUNTER — Ambulatory Visit: Payer: BC Managed Care – PPO | Admitting: Obstetrics

## 2022-09-26 ENCOUNTER — Ambulatory Visit
Admission: RE | Admit: 2022-09-26 | Discharge: 2022-09-26 | Disposition: A | Discharge status: Home / Self Care | Payer: No Typology Code available for payment source | Source: Ambulatory Visit

## 2022-09-26 VITALS — BP 138/97 | HR 109 | Temp 97.9°F | Resp 16

## 2022-09-26 DIAGNOSIS — A084 Viral intestinal infection, unspecified: Secondary | ICD-10-CM | POA: Diagnosis not present

## 2022-09-26 NOTE — Discharge Instructions (Addendum)
Follow up here or with your primary care provider if your symptoms are worsening or not improving.     

## 2022-09-26 NOTE — ED Triage Notes (Signed)
Patient presents to UC for body aches, ear pain, sore throat x 2.5 weeks. States vomiting and diarrhea this morning. Treating symptoms with mucinex and vitamin c with no improvement.
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
## 2022-10-24 ENCOUNTER — Encounter: Payer: Self-pay | Admitting: Psychiatry

## 2022-10-24 ENCOUNTER — Ambulatory Visit (INDEPENDENT_AMBULATORY_CARE_PROVIDER_SITE_OTHER): Payer: 59 | Admitting: Psychiatry

## 2022-10-24 VITALS — BP 127/79 | HR 84 | Temp 98.3°F | Ht 64.0 in | Wt 166.0 lb

## 2022-10-24 DIAGNOSIS — F1220 Cannabis dependence, uncomplicated: Secondary | ICD-10-CM | POA: Diagnosis not present

## 2022-10-24 DIAGNOSIS — F331 Major depressive disorder, recurrent, moderate: Secondary | ICD-10-CM

## 2023-01-04 DIAGNOSIS — Z20822 Contact with and (suspected) exposure to covid-19: Secondary | ICD-10-CM | POA: Diagnosis not present

## 2023-01-04 DIAGNOSIS — M791 Myalgia, unspecified site: Secondary | ICD-10-CM | POA: Diagnosis not present

## 2023-01-04 DIAGNOSIS — R070 Pain in throat: Secondary | ICD-10-CM | POA: Diagnosis not present

## 2023-01-28 DIAGNOSIS — R300 Dysuria: Secondary | ICD-10-CM | POA: Diagnosis not present

## 2023-02-10 DIAGNOSIS — R300 Dysuria: Secondary | ICD-10-CM | POA: Diagnosis not present

## 2023-02-10 DIAGNOSIS — N10 Acute tubulo-interstitial nephritis: Secondary | ICD-10-CM | POA: Diagnosis not present

## 2023-02-10 DIAGNOSIS — Z20822 Contact with and (suspected) exposure to covid-19: Secondary | ICD-10-CM | POA: Diagnosis not present

## 2023-02-10 DIAGNOSIS — R509 Fever, unspecified: Secondary | ICD-10-CM | POA: Diagnosis not present

## 2023-08-04 DIAGNOSIS — F32A Depression, unspecified: Secondary | ICD-10-CM | POA: Diagnosis not present

## 2023-08-04 DIAGNOSIS — F419 Anxiety disorder, unspecified: Secondary | ICD-10-CM | POA: Diagnosis not present

## 2023-08-04 DIAGNOSIS — Z13220 Encounter for screening for lipoid disorders: Secondary | ICD-10-CM | POA: Diagnosis not present

## 2023-08-04 DIAGNOSIS — Z131 Encounter for screening for diabetes mellitus: Secondary | ICD-10-CM | POA: Diagnosis not present

## 2023-08-04 DIAGNOSIS — Z130 Encounter for screening for diseases of the blood and blood-forming organs and certain disorders involving the immune mechanism: Secondary | ICD-10-CM | POA: Diagnosis not present

## 2023-08-04 DIAGNOSIS — Z0000 Encounter for general adult medical examination without abnormal findings: Secondary | ICD-10-CM | POA: Diagnosis not present

## 2023-08-08 DIAGNOSIS — N390 Urinary tract infection, site not specified: Secondary | ICD-10-CM | POA: Diagnosis not present

## 2023-08-08 DIAGNOSIS — R300 Dysuria: Secondary | ICD-10-CM | POA: Diagnosis not present

## 2023-10-14 DIAGNOSIS — J069 Acute upper respiratory infection, unspecified: Secondary | ICD-10-CM | POA: Diagnosis not present

## 2023-10-14 DIAGNOSIS — R519 Headache, unspecified: Secondary | ICD-10-CM | POA: Diagnosis not present

## 2023-10-14 DIAGNOSIS — Z20822 Contact with and (suspected) exposure to covid-19: Secondary | ICD-10-CM | POA: Diagnosis not present

## 2023-10-22 NOTE — Progress Notes (Unsigned)
BH MD/PA/NP OP Progress Note  10/23/2023 12:55 PM Holly Farrell  MRN:  [id number]  Chief Complaint:  Chief Complaint  Patient presents with   Follow-up   HPI:  This is a follow-up appointment for mood disorder and marijuana use disorder.  She has not seen since Nov 2023.  She states that she thinks she has bipolar disorder, although she was in denial.  She thinks she needs to be on mood stabilizer.  She has had "manic" episode as described below.  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  She was interested in furthering her career.  She has decided to move in to her mother.  This was recommended by her father, who she reports has been a good source of support.  She describes her mother as narcissistic.  Although she states that she is not the worst mom, she reports stress as she feels invalidated by her.  She talks about an example of sharing that her mom of her 2 close friends, one of them died by suicide.  Her mother ended up talking about herself.  She was crying and depressed in relation to this. She is also stressed with her daughter.  Her daughter goes to [place] immersion school, and has ADHD.  They have been able to communicate their feelings, and try to respect with each other. She is not in a relationship, and she is trying to work on herself.  She asks if she can be prescribed Xanax or clonazepam so that she can go to sleep/spot anxiety.  She also asked if she can get accommodation at school.  She shares an episode of her having intense anxiety, when she felt she was caught out in school.    Depression- The patient has mood symptoms as in PHQ-9/GAD-7.  Although she reports passive fleeting SI, she adamantly denies any plan or intent.  She wants to live.  Although she has been on Lexapro, she is thinks it might be closing weight gain.   Mania- she reports up to a few days of decreased need for sleep, cleaning the house.  She has racing thoughts, and she was all over the  place yesterday.  She feels restless, and discombobulated.  Although she is aware and not buying things, she has impulse to do shopping $200-300.   PTSD-she denies nightmares.  She denies intrusive thoughts or flashbacks.  She has hypervigilance.   Wt Readings from Last 3 Encounters:  10/23/23 170 lb (77.1 kg)  10/24/22 166 lb (75.3 kg)  05/03/22 155 lb (70.3 kg)     Support: (mother) Household:  48 yo daughter, mother Marital status:(in relationship with her [place] boyfriend for one year) Number of children: 1 Employment: employed Education:  enrolled in school to become a IT consultant Last PCP / ongoing medical evaluation:    Substance use  Tobacco Alcohol Other substances/  Current denies Denies except last night for the past few months Denies for the past few months  Past - 6 shots Used to use weed every day, cocaine when she was a teenager  Past Treatment         Wt Readings from Last 3 Encounters:  10/23/23 170 lb (77.1 kg)  10/24/22 166 lb (75.3 kg)  05/03/22 155 lb (70.3 kg)     Visit Diagnosis:    ICD-10-CM   1. Unspecified mood (affective) disorder (HCC)  F39       Past Psychiatric History: Please see initial evaluation for full details.  I have reviewed the history. No updates at this time.     Past Medical History:  Past Medical History:  Diagnosis Date   Acne 01/28/2017   Acute drug intoxication with perceptual disturbance (HCC) 09/13/2014   Anxiety    Bipolar disorder (HCC)    Bipolar II disorder (HCC) 06/25/2016   Depression    Family history of ovarian cancer    GERD (gastroesophageal reflux disease) 01/28/2017   Hemorrhoid 01/16/2018   History of head injury 09/23/2014   Overview:  Patient had TV fall on her head at age 53   Mania (monopolar) single episode or unspecified 09/23/2014   Overview:  Unclear if substance induced or true Bipolar Type I   Sedative, hypnotic or anxiolytic use disorder, mild, 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 I have reviewed the history. No updates at this time.     Family History:  Family History  Problem Relation Age of Onset   Depression Mother    Anxiety disorder Mother    Bipolar disorder Mother    Hypertension Mother    Asthma Father    Alcohol abuse Father    Sleep disorder Brother    Hypertension Maternal Grandmother    Bipolar disorder Maternal Grandfather    Ovarian cancer Paternal Grandmother     Social History:  Social History   Socioeconomic History   Marital status: Single    Spouse name: Not on file   Number of children: 1   Years of education: Not on file   Highest education level: Some college, no degree  Occupational History   Not on file  Tobacco Use   Smoking status: Former    Current packs/day: 0.10    Average packs/day: 0.1 packs/day for 9.1 years (0.9 ttl pk-yrs)    Types: E-cigarettes, Cigarettes    Start date: 10/03/2014   Smokeless tobacco: Never  Vaping Use   Vaping status: Former  Substance and Sexual Activity   Alcohol use: Yes    Alcohol/week: 5.0 - 7.0 standard drinks of alcohol    Types: 2 - 3 Glasses of wine, 3 - 4 Cans of beer per week    Comment: 2-3 times per month   Drug use: Yes    Frequency: 1.0 times per week    Types: Marijuana    Comment: only using marijuana- other drugs no longer using   Sexual activity: Yes    Partners: Male    Birth control/protection: Condom  Other Topics Concern   Not on file  Social History Narrative   Not on file   Social Determinants of Health   Financial Resource Strain: Low Risk  (08/04/2023)   Received from [facility]   Overall Financial Resource Strain (CARDIA)    Difficulty of Paying Living Expenses: Not very hard  Food Insecurity: Food  Insecurity Present (08/04/2023)   Received from [facility]   Hunger Vital Sign    Worried About Running Out of Food in the Last Year: Sometimes true    Ran Out of Food in the Last Year: Sometimes true  Transportation Needs: No Transportation Needs (08/04/2023)   Received from [facility]   [facility] - Transportation  In the past 12 months, has lack of transportation kept you from medical appointments or from getting medications?: No    Lack of Transportation (Non-Medical): No  Physical Activity: Inactive (05/08/2018)   Exercise Vital Sign    Days of Exercise per Week: 0 days    Minutes of Exercise per Session: 0 min  Stress: Not on file  Social Connections: Not on file    Allergies:  Allergies  Allergen Reactions   Hydrocodone Hives   Latex Swelling    Swelling of lips and rash   Lamictal [Lamotrigine] Rash   Tramadol Rash    Metabolic Disorder Labs: Lab Results  Component Value Date   HGBA1C 5.1 06/26/2016   Lab Results  Component Value Date   PROLACTIN 43.9 (H) 06/26/2016   Lab Results  Component Value Date   CHOL 220 (H) 06/26/2016   TRIG 102 06/26/2016   HDL 65 06/26/2016   CHOLHDL 3.4 06/26/2016   VLDL 20 06/26/2016   LDLCALC 135 (H) 06/26/2016   LDLCALC 153 (H) 10/30/2015   Lab Results  Component Value Date   TSH 3.386 06/26/2016    Therapeutic Level Labs: No results found for: "LITHIUM" No results found for: "VALPROATE" No results found for: "CBMZ"  Current Medications: Current Outpatient Medications  Medication Sig Dispense Refill   ARIPiprazole (ABILIFY) 5 MG tablet Take 1 tablet (5 mg total) by mouth at bedtime. 30 tablet 1   hydrOXYzine (ATARAX) 25 MG tablet Take 1 tablet (25 mg total) by mouth daily as needed for anxiety. 30 tablet 1   propranolol (INDERAL) 10 MG tablet Take 10 mg by mouth as needed.     escitalopram (LEXAPRO) 10 MG tablet Take 10 mg by mouth daily.     No current facility-administered  medications for this visit.     Musculoskeletal: Strength & Muscle Tone: within normal limits Gait & Station: normal Patient leans: N/A  Psychiatric Specialty Exam: Review of Systems  Psychiatric/Behavioral:  Positive for dysphoric mood, sleep disturbance and suicidal ideas. Negative for agitation, behavioral problems, confusion, decreased concentration, hallucinations and self-injury. The patient is nervous/anxious. The patient is not hyperactive.   All other systems reviewed and are negative.   Blood pressure 135/87, pulse 92, temperature 98.4 F (36.9 C), temperature source Skin, height 5\' 4"  (1.626 m), weight 170 lb (77.1 kg).Body mass index is 29.18 kg/m.  General Appearance: Well Groomed  Eye Contact:  Good  Speech:  Clear and Coherent and fast, but not pressured  Volume:  Normal  Mood:   "manic"  Affect:  Appropriate, Congruent, Tearful, and calm  Thought Process:  Coherent  Orientation:  Full (Time, Place, and Person)  Thought Content: Logical   Suicidal Thoughts:  No  Homicidal Thoughts:  No  Memory:  Immediate;   Good  Judgement:  Good  Insight:  Good  Psychomotor Activity:  Normal  Concentration:  Concentration: Good and Attention Span: Good  Recall:  Good  Fund of Knowledge: Good  Language: Good  Akathisia:  No  Handed:  Right  AIMS (if indicated): not done  Assets:  Communication Skills Desire for Improvement  ADL's:  Intact  Cognition: WNL  Sleep:  Poor   Screenings: AIMS    Flowsheet Row Admission (Discharged) from 06/25/2016 in [facility]  AIMS Total Score 0      GAD-7    Flowsheet Row Office Visit from 10/23/2023 in [facility] Office Visit from 10/24/2022 in [facility]  Total GAD-7 Score 13 7      PHQ2-9    Flowsheet Row Office Visit from 10/23/2023 in [facility] Office Visit from 10/24/2022 in  [facility]  PHQ-2 Total Score 2 3  PHQ-9 Total Score 16 14      Flowsheet Row Office Visit from 10/23/2023 in [facility] Office Visit from 10/24/2022 in [facility] ED from 09/26/2022 in [facility]   C-SSRS RISK CATEGORY Error: Q3, 4, or 5 should not be populated when Q2 is No Error: Q3, 4, or 5 should not be populated when Q2 is No No Risk        Assessment and Plan:  Holly Farrell is a 27 y.o. year old female with a history of bipolar disorder, likely substance induced, marijuana use disorder, polysubstance use disorder, GERD, who is referred for bipolar disorder.   1. Unspecified mood (affective) disorder (HCC) R/o bipolar II disorder R/o mixed features R/o PTSD Acute stressors include: job, school work Other stressors include: conflict with her mother, absence of nurturing in childhood, miscarriage in 2023   History: OD ibuprofen at age 78, OD xanax at age 30 in the context of drug use  She demonstrates rapid speech, though she demonstrates organized thought process and calm demeanor.  She reports subthreshold hypomanic symptoms of decreased need for sleep, increased energy, impulsivity for shopping (although she is able to hold the impulse), restlessness.  Current stressors exacerbate her mood condition,  and although she has been able to maintain periods of abstinence, there is still a risk associated with her substance use. Will add Abilify to target these symptoms.  Discussed potential metabolic side effect, EPS and QTc prolongation.  Will plan to obtain EKG after we know that she will stay on this medication.  Noted that she does not have any cardiac condition, and the risk of TdP is low.  Will continue Lexapro at the current dose at this time to target anxiety, if the symptoms and depression.  She has had weight gain, which is likely  attributable to this medication.  Will consider switching to other antidepressant in the future.  Will start hydroxyzine as needed for anxiety.  Noted that she is informed that this writer will not prescribe benzodiazepines due to her history of substance, PTSD, and the risk likely with the benefit.  She expressed understanding. She has also been informed that the forms for accomodation cannot be completed until six months of care have been established.  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   Plan Continue lexapro 10 mg daily Start Abilify 5 mg at night  Start hydroxyzine 25 mg daily as needed for severe anxiety  Next appointment: 12/31 at 10 AM  (she will lose her insurance)  Past trials of medication- Quetiapine (Zombie), lamotrigine (rash), Latuda (less emotions)   The patient demonstrates the following risk factors for suicide: Chronic risk factors for suicide include: psychiatric disorder of depression, substance use disorder, and previous suicide attempts of overdosing medication . Acute risk factors for suicide include: family or marital conflict and unemployment. Protective factors for this patient include: responsibility to others (children, family) and hope for the future. Considering these factors, the overall suicide risk at this point appears to be  low. Patient is appropriate for outpatient follow up.   Collaboration of Care: Collaboration of Care: Other reviewed notes in Epic  Patient/Guardian was advised Release of Information must be obtained prior to any record release in order to collaborate their care with an outside provider. Patient/Guardian was advised if they have not already done so to contact the registration department to sign all necessary forms in order for [place] to release information regarding their care.   Consent:  Patient/Guardian gives verbal consent for treatment and assignment of benefits for services provided during this visit. Patient/Guardian expressed understanding and agreed to proceed.   The duration of the time spent on the following activities on the date of the encounter was 40 minutes.   Preparing to see the patient (e.g., review of test, records)  Obtaining and/or reviewing separately obtained history  Performing a medically necessary exam and/or evaluation  Counseling and educating the patient/family/caregiver  Ordering medications, tests, or procedures  Referring and communicating with other healthcare professionals (when not reported separately)  Documenting clinical information in the electronic or paper health record  Independently interpreting results of tests/labs and communication of results to the family or caregiver  Care coordination (when not reported separately)    Neysa Hotter, MD 10/23/2023, 12:55 PM

## 2023-10-23 ENCOUNTER — Ambulatory Visit (INDEPENDENT_AMBULATORY_CARE_PROVIDER_SITE_OTHER): Payer: Medicaid Other | Admitting: Psychiatry

## 2023-10-23 ENCOUNTER — Encounter: Payer: Self-pay | Admitting: Psychiatry

## 2023-10-23 ENCOUNTER — Telehealth: Payer: Self-pay

## 2023-10-23 VITALS — BP 135/87 | HR 92 | Temp 98.4°F | Ht 64.0 in | Wt 170.0 lb

## 2023-10-23 DIAGNOSIS — F39 Unspecified mood [affective] disorder: Secondary | ICD-10-CM

## 2023-10-23 MED ORDER — HYDROXYZINE HCL 25 MG PO TABS: 25.0000 mg | ORAL_TABLET | Freq: Every day | ORAL | 1 refills | Status: DC | PRN

## 2023-10-23 MED ORDER — HYDROXYZINE HCL 10 MG PO TABS: 10.0000 mg | ORAL_TABLET | Freq: Every day | ORAL | 1 refills | Status: DC | PRN

## 2023-10-23 MED ORDER — ARIPIPRAZOLE 5 MG PO TABS: 5.0000 mg | ORAL_TABLET | Freq: Every day | ORAL | 1 refills | Status: DC

## 2023-10-23 NOTE — Telephone Encounter (Signed)
received fax that a prior auth was needed for the aripiprazole.  

## 2023-10-23 NOTE — Telephone Encounter (Signed)
received fax that prior auth for the aripiprazole was approved from 10-23-23 to 10-22-24 Track [id number]

## 2023-10-23 NOTE — Patient Instructions (Addendum)
Continue lexapro 10 mg daily Start Abilify 5 mg at night  Start hydroxyzine 25 mg daily as needed for severe anxiety  Next appointment: 12/31 at 10 AM

## 2023-10-23 NOTE — Telephone Encounter (Signed)
went online to [email or web address] and submitted the prior auth for the aripiprazole. - pending

## 2023-10-24 NOTE — Telephone Encounter (Signed)
Pt.notified

## 2023-11-05 ENCOUNTER — Telehealth: Payer: 59 | Admitting: Psychiatry

## 2023-11-14 ENCOUNTER — Other Ambulatory Visit: Payer: Self-pay | Admitting: Psychiatry

## 2023-12-05 ENCOUNTER — Encounter (INDEPENDENT_AMBULATORY_CARE_PROVIDER_SITE_OTHER): Payer: Self-pay

## 2023-12-05 DIAGNOSIS — R5383 Other fatigue: Secondary | ICD-10-CM

## 2023-12-05 DIAGNOSIS — R454 Irritability and anger: Secondary | ICD-10-CM | POA: Diagnosis not present

## 2023-12-05 DIAGNOSIS — F0630 Mood disorder due to known physiological condition, unspecified: Secondary | ICD-10-CM

## 2023-12-05 DIAGNOSIS — R635 Abnormal weight gain: Secondary | ICD-10-CM | POA: Diagnosis not present

## 2023-12-05 MED ORDER — LURASIDONE HCL 20 MG PO TABS: 20.0000 mg | ORAL_TABLET | Freq: Every day | ORAL | 0 refills | Status: DC

## 2023-12-05 NOTE — Telephone Encounter (Signed)
Spoke with the patient.   She states that she has been feeling irritable since starting Abilify, and she has gained 10 pounds.  Her family commented that she has been mean.  Although she denies any HI or aggression, she has noticed a tendency to be irritable.  She was also dealing with drowsiness, and had cut down Abilify to 2.5 mg daily.  She agrees with the following.   - discontinue Abilify  - start latuda 20 mg daily with meal. Discussed risk of EPS, metabolic side effect, QTc prolongation.  -Advised her to use emergency resources if any worsening symptoms over the weekend.   I've spent a total time of 7 minutes providing service to this patient-generated inquiry in the MyChart message

## 2023-12-18 NOTE — Progress Notes (Deleted)
BH MD/PA/NP OP Progress Note  12/18/2023 2:31 PM Holly Farrell  MRN:  [id number]  Chief Complaint: No chief complaint on file.  HPI: ***   She states that she has been feeling irritable since starting Abilify, and she has gained 10 pounds.  Her family commented that she has been mean.  Although she denies any HI or aggression, she has noticed a tendency to be irritable.  She was also dealing with drowsiness, and had cut down Abilify to 2.5 mg daily.  She agrees with the following.    - discontinue Abilify  - start latuda 20 mg daily with meal. Discussed risk of EPS, metabolic side effect, QTc prolongation.  -Advised her to use emergency resources if any worsening symptoms over the weekend.   Visit Diagnosis: No diagnosis found.  Past Psychiatric History: Please see initial evaluation for full details. I have reviewed the history. No updates at this time.     Past Medical History:  Past Medical History:  Diagnosis Date   Acne 01/28/2017   Acute drug intoxication with perceptual disturbance (HCC) 09/13/2014   Anxiety    Bipolar disorder (HCC)    Bipolar II disorder (HCC) 06/25/2016   Depression    Family history of ovarian cancer    GERD (gastroesophageal reflux disease) 01/28/2017   Hemorrhoid 01/16/2018   History of head injury 09/23/2014   Overview:  Patient had TV fall on her head at age 27   Mania (monopolar) single episode or unspecified 09/23/2014   Overview:  Unclear if substance induced or true Bipolar Type I   Sedative, hypnotic or anxiolytic use disorder, mild, 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 I have reviewed the history. No updates at this time.     Family History:  Family History  Problem Relation Age of Onset    Depression Mother    Anxiety disorder Mother    Bipolar disorder Mother    Hypertension Mother    Asthma Father    Alcohol abuse Father    Sleep disorder Brother    Hypertension Maternal Grandmother    Bipolar disorder Maternal Grandfather    Ovarian cancer Paternal Grandmother     Social History:  Social History   Socioeconomic History   Marital status: Single    Spouse name: Not on file   Number of children: 1   Years of education: Not on file   Highest education level: Some college, no degree  Occupational History   Not on file  Tobacco Use   Smoking status: Former    Current packs/day: 0.10    Average packs/day: 0.1 packs/day for 9.2 years (0.9 ttl pk-yrs)    Types: E-cigarettes, Cigarettes    Start date: 10/03/2014   Smokeless tobacco: Never  Vaping Use   Vaping status: Former  Substance and Sexual Activity   Alcohol use: Yes    Alcohol/week: 5.0 - 7.0 standard drinks of alcohol    Types: 2 - 3 Glasses of wine, 3 - 4 Cans of beer per week    Comment: 2-3 times per month   Drug use: Yes    Frequency: 1.0 times per week    Types: Marijuana    Comment: only using marijuana- other drugs  no longer using   Sexual activity: Yes    Partners: Male    Birth control/protection: Condom  Other Topics Concern   Not on file  Social History Narrative   Not on file   Social Drivers of Health   Financial Resource Strain: Low Risk  (08/04/2023)   Received from [facility]   Overall Financial Resource Strain (CARDIA)    Difficulty of Paying Living Expenses: Not very hard  Food Insecurity: Food Insecurity Present (08/04/2023)   Received from [facility]   Hunger Vital Sign    Worried About Running Out of Food in the Last Year: Sometimes true    Ran Out of Food in the Last Year: Sometimes true  Transportation Needs: No Transportation Needs (08/04/2023)   Received from [facility] - Transportation    In the past  12 months, has lack of transportation kept you from medical appointments or from getting medications?: No    Lack of Transportation (Non-Medical): No  Physical Activity: Inactive (05/08/2018)   Exercise Vital Sign    Days of Exercise per Week: 0 days    Minutes of Exercise per Session: 0 min  Stress: Not on file  Social Connections: Not on file    Allergies:  Allergies  Allergen Reactions   Hydrocodone Hives   Latex Swelling    Swelling of lips and rash   Lamictal [Lamotrigine] Rash   Tramadol Rash    Metabolic Disorder Labs: Lab Results  Component Value Date   HGBA1C 5.1 06/26/2016   Lab Results  Component Value Date   PROLACTIN 43.9 (H) 06/26/2016   Lab Results  Component Value Date   CHOL 220 (H) 06/26/2016   TRIG 102 06/26/2016   HDL 65 06/26/2016   CHOLHDL 3.4 06/26/2016   VLDL 20 06/26/2016   LDLCALC 135 (H) 06/26/2016   LDLCALC 153 (H) 10/30/2015   Lab Results  Component Value Date   TSH 3.386 06/26/2016    Therapeutic Level Labs: No results found for: "LITHIUM" No results found for: "VALPROATE" No results found for: "CBMZ"  Current Medications: Current Outpatient Medications  Medication Sig Dispense Refill   escitalopram (LEXAPRO) 10 MG tablet Take 10 mg by mouth daily.     hydrOXYzine (ATARAX) 25 MG tablet Take 1 tablet (25 mg total) by mouth daily as needed for anxiety. 90 tablet 0   lurasidone (LATUDA) 20 MG TABS tablet Take 1 tablet (20 mg total) by mouth daily. 30 tablet 0   propranolol (INDERAL) 10 MG tablet Take 10 mg by mouth as needed.     No current facility-administered medications for this visit.     Musculoskeletal: Strength & Muscle Tone: within normal limits Gait & Station: normal Patient leans: N/A  Psychiatric Specialty Exam: Review of Systems  There were no vitals taken for this visit.There is no height or weight on file to calculate BMI.  General Appearance: {Appearance:22683}  Eye Contact:  {B[facility] EYE CONTACT:22684}   Speech:  Clear and Coherent  Volume:  Normal  Mood:  {BHH MOOD:22306}  Affect:  {Affect (PAA):22687}  Thought Process:  Coherent  Orientation:  Full (Time, Place, and Person)  Thought Content: Logical   Suicidal Thoughts:  {ST/HT (PAA):22692}  Homicidal Thoughts:  {ST/HT (PAA):22692}  Memory:  Immediate;   Good  Judgement:  {Judgement (PAA):22694}  Insight:  {Insight (PAA):22695}  Psychomotor Activity:  Normal  Concentration:  Concentration: Good and Attention Span: Good  Recall:  Good  Fund of Knowledge: Good  Language: Good  Akathisia:  No  Handed:  Right  AIMS (if indicated): not done  Assets:  Communication Skills Desire for Improvement  ADL's:  Intact  Cognition: WNL  Sleep:  {B[facility] GOOD/FAIR/POOR:22877}   Screenings: AIMS    Flowsheet Row Admission (Discharged) from 06/25/2016 in [facility]  AIMS Total Score 0      GAD-7    Flowsheet Row Office Visit from 10/23/2023 in [facility] Office Visit from 10/24/2022 in [facility]  Total GAD-7 Score 13 7      PHQ2-9    Flowsheet Row Office Visit from 10/23/2023 in [facility] Office Visit from 10/24/2022 in [facility]  PHQ-2 Total Score 2 3  PHQ-9 Total Score 16 14      Flowsheet Row [facility] Visit [facility]23/20[facility] Office Visit from 10/24/2022 in [facility] ED from 09/26/2022 in [facility]   C-SSRS RISK CATEGORY Error: Q3, 4, or 5 should not be populated when Q2 is No Error: Q3, 4, or 5 should not be populated when Q2 is No No Risk        Assessment and Plan:  KIRK CHEFF is a 27 y.o. year old female with a history of bipolar disorder, likely substance induced, marijuana use disorder, polysubstance use  disorder, GERD, who is referred for bipolar disorder.    1. Unspecified mood (affective) disorder (HCC) R/o bipolar II disorder R/o mixed features R/o PTSD Acute stressors include: job, school work Other stressors include: conflict with her mother, absence of nurturing in childhood, miscarriage in 2023   History: OD ibuprofen at age 44, OD xanax at age 3 in the context of drug use  She demonstrates rapid speech, though she demonstrates organized thought process and calm demeanor.  She reports subthreshold hypomanic symptoms of decreased need for sleep, increased energy, impulsivity for shopping (although she is able to hold the impulse), restlessness.  Current stressors exacerbate her mood condition,  and although she has been able to maintain periods of abstinence, there is still a risk associated with her substance use. Will add Abilify to target these symptoms.  Discussed potential metabolic side effect, EPS and QTc prolongation.  Will plan to obtain EKG after we know that she will stay on this medication.  Noted that she does not have any cardiac condition, and the risk of TdP is low.  Will continue Lexapro at the current dose at this time to target anxiety, if the symptoms and depression.  She has had weight gain, which is likely attributable to this medication.  Will consider switching to other antidepressant in the future.  Will start hydroxyzine as needed for anxiety.  Noted that she is informed that this writer will not prescribe benzodiazepines due to her history of substance, PTSD, and the risk likely with the benefit.  She expressed understanding. She has also been informed that the forms for accomodation cannot be completed until six months of care have been established.   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    Plan Continue lexapro  10 mg daily Start Abilify 5 mg at night  Start hydroxyzine 25 mg daily as needed for severe  anxiety  Next appointment: 12/31 at 10 AM  (she will lose her insurance)   Past trials of medication- Quetiapine (Zombie), lamotrigine (rash), Latuda (less emotions)   The patient demonstrates the following risk factors for suicide: Chronic risk factors for suicide include: psychiatric disorder of depression, substance use disorder, and previous suicide attempts of overdosing medication . Acute risk factors for suicide include: family or marital conflict and unemployment. Protective factors for this patient include: responsibility to others (children, family) and hope for the future. Considering these factors, the overall suicide risk at this point appears to be low. Patient is appropriate for outpatient follow up.     Collaboration of Care: Collaboration of Care: {BH OP Collaboration of Care:21014065}  Patient/Guardian was advised Release of Information must be obtained prior to any record release in order to collaborate their care with an outside provider. Patient/Guardian was advised if they have not already done so to contact the registration department to sign all necessary forms in order for [place] to release information regarding their care.   Consent: Patient/Guardian gives verbal consent for treatment and assignment of benefits for services provided during this visit. Patient/Guardian expressed understanding and agreed to proceed.    Neysa Hotter, MD 12/18/2023, 2:31 PM

## 2023-12-22 ENCOUNTER — Ambulatory Visit (INDEPENDENT_AMBULATORY_CARE_PROVIDER_SITE_OTHER): Payer: Medicaid Other | Admitting: Psychiatry

## 2023-12-22 ENCOUNTER — Encounter: Payer: Self-pay | Admitting: Psychiatry

## 2023-12-22 VITALS — BP 127/79 | HR 89 | Temp 97.4°F | Ht 64.0 in | Wt 186.0 lb

## 2023-12-22 DIAGNOSIS — R41840 Attention and concentration deficit: Secondary | ICD-10-CM | POA: Diagnosis not present

## 2023-12-22 DIAGNOSIS — Z79899 Other long term (current) drug therapy: Secondary | ICD-10-CM

## 2023-12-22 DIAGNOSIS — F39 Unspecified mood [affective] disorder: Secondary | ICD-10-CM

## 2023-12-22 MED ORDER — LURASIDONE HCL 20 MG PO TABS: 20.0000 mg | ORAL_TABLET | Freq: Every day | ORAL | 0 refills | Status: DC

## 2023-12-22 NOTE — Patient Instructions (Signed)
Continue lexapro 10 mg daily Continue latuda 20 mg daily  Continue hydroxyzine 25 mg daily as needed for severe anxiety  Obtain lab (TSH, lipid, BMP) at [facility] Referral for evaluation of ADHD Next appointment: 2/17 at 10 AM

## 2023-12-22 NOTE — Progress Notes (Signed)
BH MD/PA/NP OP Progress Note  12/22/2023 12:14 PM Holly Farrell  MRN:  [id number]  Chief Complaint:  Chief Complaint  Patient presents with   Follow-up   HPI:  This is a follow-up appointment for unspecified mood disorder.  She states that she has been feeling much better since being on Latuda.  She feels calmer, and more regulated.  Although she usually does not enjoy holiday, it was enjoyable as she was not as anxious.  She believes she is not as "mean" as she was on Abilify.  She gained weight significantly when she was on Abilify, although she is unsure if has any weight gain since being on Latuda.  She denied change in appetite.  She is concerned about her insomnia.  She is unable to sleep through the night since being on this medication.  Her mother also commented that she is not as empathic since she is on Lexapro.  However, she believes anxiety has been much better since being on Lexapro, and she feels nervous about coming off this medication, although she is open to this if that is the recommended. The patient has mood symptoms as in PHQ-9/GAD-7.She is looking forward to the trip to [place] to see her father, who she sees a few times per year.  She denies SI.  She denies decreased need for sleep or euphoria, and goal-directed activity.   She states that her daughter has ADHD, and she wonders if she has the same condition.  She reports that she was told by a teacher when she was a child that she may have ADHD, although her parents did not pursue evaluation that time.  She has been forgetful.  She feels like she has "mental gymnastic." It is very hard for her to focus, and she is overwhelmed with tasks.  She does either clean or not clean.  She would like to pursue evaluation at this time.   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 Unspecified mood (affective) disorder (HCC)  F39 TSH    2. Inattention  R41.840 Ambulatory referral to Psychology    3. High risk medication use  Z79.899 Lipid panel    Basic metabolic panel      Past Psychiatric History: Please see initial evaluation for full details. I have reviewed the history. No updates at this time.     Past Medical History:  Past Medical History:  Diagnosis Date   Acne 01/28/2017   Acute drug intoxication with perceptual disturbance (HCC) 09/13/2014   Anxiety    Bipolar disorder (HCC)    Bipolar II disorder (HCC) 06/25/2016   Depression    Family history of ovarian cancer    GERD (gastroesophageal reflux disease) 01/28/2017   Hemorrhoid 01/16/2018   History of head injury 09/23/2014   Overview:  Patient had TV fall on her head at age 20   Mania (monopolar) single episode or unspecified 09/23/2014   Overview:  Unclear if substance induced or true Bipolar Type I   Sedative, hypnotic or anxiolytic use disorder, mild, 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 I have reviewed the history. No updates at this time.     Family History:  Family History  Problem Relation Age of Onset   Depression Mother    Anxiety disorder Mother    Bipolar disorder Mother    Hypertension Mother    Asthma Father    Alcohol abuse Father    Sleep disorder Brother    Hypertension Maternal Grandmother    Bipolar disorder Maternal Grandfather    Ovarian cancer Paternal Grandmother     Social History:  Social History   Socioeconomic History   Marital status: Single    Spouse name: Not on file    Number of children: 1   Years of education: Not on file   Highest education level: Some college, no degree  Occupational History   Not on file  Tobacco Use   Smoking status: Former    Current packs/day: 0.10    Average packs/day: 0.1 packs/day for 9.2 years (0.9 ttl pk-yrs)    Types: E-cigarettes, Cigarettes    Start date: 10/03/2014   Smokeless tobacco: Never  Vaping Use   Vaping status: Former  Substance and Sexual Activity   Alcohol use: Yes    Alcohol/week: 5.0 - 7.0 standard drinks of alcohol    Types: 2 - 3 Glasses of wine, 3 - 4 Cans of beer per week    Comment: 2-3 times per month   Drug use: Yes    Frequency: 1.0 times per week    Types: Marijuana    Comment: only using marijuana- other drugs no longer using   Sexual activity: Yes    Partners: Male    Birth control/protection: Condom  Other Topics Concern   Not on file  Social History Narrative   Not on file   Social Drivers of Health   Financial Resource Strain: Low Risk  (08/04/2023)   Received from [facility]   Overall Financial Resource Strain (CARDIA)    Difficulty of Paying Living Expenses: Not very hard  Food Insecurity: Food Insecurity Present (08/04/2023)   Received from [facility]   Hunger Vital Sign    Worried About Running Out of Food in the Last Year: Sometimes true    Ran Out of Food in the Last Year: Sometimes true  Transportation Needs: No Transportation Needs (08/04/2023)   Received from [facility]   PRAPARE - Transportation    In the past 12 months, has lack of transportation kept you from medical appointments or from getting medications?: No    Lack of Transportation (Non-Medical): No  Physical Activity: Inactive (05/08/2018)   Exercise Vital Sign    Days of Exercise per Week: 0 days    Minutes of Exercise per Session: 0 min  Stress: Not on file  Social Connections: Not on file    Allergies:  Allergies  Allergen Reactions    Hydrocodone Hives   Latex Swelling    Swelling of lips and rash   Lamictal [Lamotrigine] Rash   Tramadol Rash    Metabolic Disorder Labs: Lab Results  Component Value Date   HGBA1C 5.1 06/26/2016   Lab Results  Component Value Date   PROLACTIN 43.9 (H) 06/26/2016   Lab Results  Component Value Date   CHOL 220 (H) 06/26/2016   TRIG 102 06/26/2016  HDL 65 06/26/2016   CHOLHDL 3.4 06/26/2016   VLDL 20 06/26/2016   LDLCALC 135 (H) 06/26/2016   LDLCALC 153 (H) 10/30/2015   Lab Results  Component Value Date   TSH 3.386 06/26/2016    Therapeutic Level Labs: No results found for: "LITHIUM" No results found for: "VALPROATE" No results found for: "CBMZ"  Current Medications: Current Outpatient Medications  Medication Sig Dispense Refill   escitalopram (LEXAPRO) 10 MG tablet Take 10 mg by mouth daily.     hydrOXYzine (ATARAX) 25 MG tablet Take 1 tablet (25 mg total) by mouth daily as needed for anxiety. 90 tablet 0   [START ON 01/04/2024] lurasidone (LATUDA) 20 MG TABS tablet Take 1 tablet (20 mg total) by mouth daily. 90 tablet 0   No current facility-administered medications for this visit.     Musculoskeletal: Strength & Muscle Tone: within normal limits Gait & Station: normal Patient leans: N/A  Psychiatric Specialty Exam: Review of Systems  Psychiatric/Behavioral:  Positive for decreased concentration, dysphoric mood and sleep disturbance. Negative for agitation, behavioral problems, confusion, hallucinations, self-injury and suicidal ideas. The patient is nervous/anxious. The patient is not hyperactive.   All other systems reviewed and are negative.   Blood pressure 127/79, pulse 89, temperature (!) 97.4 F (36.3 C), temperature source Skin, height 5\' 4"  (1.626 m), weight 186 lb (84.4 kg).Body mass index is 31.93 kg/m.  General Appearance: Well Groomed  Eye Contact:  Good  Speech:  Clear and Coherent, less rapid, not pressured  Volume:  Normal  Mood:    "regulated"  Affect:  Appropriate, Congruent, and calm  Thought Process:  Coherent  Orientation:  Full (Time, Place, and Person)  Thought Content: Logical   Suicidal Thoughts:  No  Homicidal Thoughts:  No  Memory:  Immediate;   Good  Judgement:  Good  Insight:  Good  Psychomotor Activity:  Normal  Concentration:  Concentration: Good and Attention Span: Good  Recall:  Good  Fund of Knowledge: Good  Language: Good  Akathisia:  No  Handed:  Right  AIMS (if indicated): not done  Assets:  Communication Skills Desire for Improvement  ADL's:  Intact  Cognition: WNL  Sleep:  Poor   Screenings: AIMS    Flowsheet Row Admission (Discharged) from 06/25/2016 in [facility]  AIMS Total Score 0      GAD-7    Flowsheet Row Office Visit from 12/22/2023 in [facility] Office Visit from 10/23/2023 in [facility] Office Visit from 10/24/2022 in [facility]  Total GAD-7 Score 4 13 7       PHQ2-9    Flowsheet Row Office Visit from 12/22/2023 in [facility] Office Visit from 10/23/2023 in [facility] Office Visit from 10/24/2022 in [facility]  PHQ-2 Total Score 2 2 3   PHQ-9 Total Score 9 16 14       Flowsheet Row Office Visit from 10/23/2023 in [facility] Office Visit from 10/24/2022 in [facility] ED from 09/26/2022 in [facility]   C-SSRS RISK CATEGORY Error: Q3, 4, or 5 should not be populated when Q2 is No Error: Q3, 4, or 5 should not be populated when Q2 is No No Risk        Assessment and Plan:  SILVA SCHOPP is a 27 y.o. year  old female with a history of bipolar disorder, likely substance induced, marijuana  use disorder, polysubstance use disorder, GERD, who is referred for bipolar disorder.   1. Unspecified mood (affective) disorder (HCC) -R/o bipolar II disorder R/o mixed features R/o PTSD Acute stressors include: job, school work Other stressors include: conflict with her mother, absence of nurturing in childhood, miscarriage in 2023   History: OD ibuprofen at age 46, OD xanax at age 78 in the context of drug use  Exam is notable for less rapid speech, and she continues to demonstrate a linear thought process and calm demeanor.  Although she had adverse reaction of weight gain, worsening in mood from Abilify, she reports good benefit from [place], feeling more regulated.  While she experiences mild depressive symptoms and anxiety, it has been more manageable, and she denies hypomanic symptoms since being on this medication.  While there is a concern of weight gain, it is unclear whether this is secondary to Lexapro, or with combination of Latuda.  She is willing to work on diet and exercise, and consider adjustment of her medication if she continues to have weight gain.  Will continue Latuda for mood dysregulation, along with Lexapro for depression and anxiety.  Will continue hydroxyzine as needed for anxiety.   2. Inattention She has a daughter, who is diagnosed with ADHD.  She also struggles with inattention, and would like to proceed with evaluation.  Make referral given comorbidity of bipolar disorder.   3. High risk medication use - Lipid panel - Basic metabolic panel The above test is ordered.  Will consider checking EKG after the next visit to monitor QTc prolongation.  Noted that she has no known cardiac disease.    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    Plan Continue lexapro 10 mg daily Continue latuda 20 mg daily - monitor weight,  insomnia Continue hydroxyzine 25 mg daily as needed for severe anxiety  Obtain lab (TSH, lipid, BMP) at [facility] Referral for evaluation of ADHD Next appointment: 2/17 at 10 AM       Past trials of medication- Quetiapine (Zombie), lamotrigine (rash), Latuda (less emotions)   The patient demonstrates the following risk factors for suicide: Chronic risk factors for suicide include: psychiatric disorder of depression, substance use disorder, and previous suicide attempts of overdosing medication . Acute risk factors for suicide include: family or marital conflict and unemployment. Protective factors for this patient include: responsibility to others (children, family) and hope for the future. Considering these factors, the overall suicide risk at this point appears to be low. Patient is appropriate for outpatient follow up.     Collaboration of Care: Collaboration of Care: Other reviewed notes in Epic  Patient/Guardian was advised Release of Information must be obtained prior to any record release in order to collaborate their care with an outside provider. Patient/Guardian was advised if they have not already done so to contact the registration department to sign all necessary forms in order for [place] to release information regarding their care.   Consent: Patient/Guardian gives verbal consent for treatment and assignment of benefits for services provided during this visit. Patient/Guardian expressed understanding and agreed to proceed.    Neysa Hotter, MD 12/22/2023, 12:14 PM

## 2023-12-23 ENCOUNTER — Ambulatory Visit: Payer: 59 | Admitting: Psychiatry

## 2024-01-03 ENCOUNTER — Other Ambulatory Visit: Payer: Self-pay | Admitting: Psychiatry

## 2024-01-06 ENCOUNTER — Telehealth: Payer: Self-pay

## 2024-01-06 NOTE — Telephone Encounter (Signed)
 went online to [email or web address] and submitted the prior auth-it came back as prior auth not required.

## 2024-01-06 NOTE — Telephone Encounter (Signed)
received fax requesting a prior auth for the lurasidone hcl 20mg .

## 2024-01-06 NOTE — Telephone Encounter (Signed)
 faxed prior auth not required info to pharmacy.

## 2024-02-03 NOTE — Progress Notes (Deleted)
 BH MD/PA/NP OP Progress Note  02/03/2024 10:51 AM Holly Farrell  MRN:  [id number]  Chief Complaint: No chief complaint on file.  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  Past Psychiatric History: Please see initial evaluation for full details. I have reviewed the history. No updates at this time.     Past Medical History:  Past Medical History:  Diagnosis Date   Acne 01/28/2017   Acute drug intoxication with perceptual disturbance (HCC) 09/13/2014   Anxiety    Bipolar disorder (HCC)    Bipolar II disorder (HCC) 06/25/2016   Depression    Family history of ovarian cancer    GERD (gastroesophageal reflux disease) 01/28/2017   Hemorrhoid 01/16/2018   History of head injury 09/23/2014   Overview:  Patient had TV fall on her head at age 70   Mania (monopolar) single episode or unspecified 09/23/2014   Overview:  Unclear if substance induced or true Bipolar Type I   Sedative, hypnotic or anxiolytic use disorder, mild, 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 I have reviewed the history. No updates at this time.     Family History:  Family History  Problem Relation Age of Onset   Depression Mother    Anxiety disorder Mother    Bipolar disorder Mother    Hypertension Mother    Asthma Father    Alcohol abuse Father    Sleep disorder Brother    Hypertension Maternal Grandmother    Bipolar disorder Maternal Grandfather    Ovarian cancer Paternal Grandmother     Social History:  Social History   Socioeconomic History   Marital status: Single    Spouse  name: Not on file   Number of children: 1   Years of education: Not on file   Highest education level: Some college, no degree  Occupational History   Not on file  Tobacco Use   Smoking status: Former    Current packs/day: 0.10    Average packs/day: 0.1 packs/day for 9.3 years (0.9 ttl pk-yrs)    Types: E-cigarettes, Cigarettes    Start date: 10/03/2014   Smokeless tobacco: Never  Vaping Use   Vaping status: Former  Substance and Sexual Activity   Alcohol use: Yes    Alcohol/week: 5.0 - 7.0 standard drinks of alcohol    Types: 2 - 3 Glasses of wine, 3 - 4 Cans of beer per week    Comment: 2-3 times per month   Drug use: Yes    Frequency: 1.0 times per week    Types: Marijuana    Comment: only using marijuana- other drugs no longer using   Sexual activity: Yes    Partners: Male    Birth control/protection: Condom  Other Topics Concern   Not on file  Social History Narrative   Not on file   Social Drivers of Health   Financial Resource Strain: Low Risk  (08/04/2023)   Received from [facility]   Overall Financial Resource Strain (CARDIA)    Difficulty of Paying Living Expenses: Not very  hard  Food Insecurity: Food Insecurity Present (08/04/2023)   Received from [facility]   Hunger Vital Sign    Worried About Running Out of Food in the Last Year: Sometimes true    Ran Out of Food in the Last Year: Sometimes true  Transportation Needs: No Transportation Needs (08/04/2023)   Received from [facility] - Transportation    In the past 12 months, has lack of transportation kept you from medical appointments or from getting medications?: No    Lack of Transportation (Non-Medical): No  Physical Activity: Inactive (05/08/2018)   Exercise Vital Sign    Days of Exercise per Week: 0 days    Minutes of Exercise per Session: 0 min  Stress: Not on file  Social Connections: Not on file    Allergies:  Allergies   Allergen Reactions   Hydrocodone Hives   Latex Swelling    Swelling of lips and rash   Lamictal [Lamotrigine] Rash   Tramadol Rash    Metabolic Disorder Labs: Lab Results  Component Value Date   HGBA1C 5.1 06/26/2016   Lab Results  Component Value Date   PROLACTIN 43.9 (H) 06/26/2016   Lab Results  Component Value Date   CHOL 220 (H) 06/26/2016   TRIG 102 06/26/2016   HDL 65 06/26/2016   CHOLHDL 3.4 06/26/2016   VLDL 20 06/26/2016   LDLCALC 135 (H) 06/26/2016   LDLCALC 153 (H) 10/30/2015   Lab Results  Component Value Date   TSH 3.386 06/26/2016    Therapeutic Level Labs: No results found for: "LITHIUM" No results found for: "VALPROATE" No results found for: "CBMZ"  Current Medications: Current Outpatient Medications  Medication Sig Dispense Refill   escitalopram (LEXAPRO) 10 MG tablet Take 10 mg by mouth daily.     hydrOXYzine (ATARAX) 25 MG tablet Take 1 tablet (25 mg total) by mouth daily as needed for anxiety. 90 tablet 0   lurasidone (LATUDA) 20 MG TABS tablet Take 1 tablet (20 mg total) by mouth daily. 90 tablet 0   No current facility-administered medications for this visit.     Musculoskeletal: Strength & Muscle Tone: within normal limits Gait & Station: normal Patient leans: N/A  Psychiatric Specialty Exam: Review of Systems  There were no vitals taken for this visit.There is no height or weight on file to calculate BMI.  General Appearance: {Appearance:22683}  Eye Contact:  {BHH EYE CONTACT:22684}  Speech:  Clear and Coherent  Volume:  Normal  Mood:  {BHH MOOD:22306}  Affect:  {Affect (PAA):22687}  Thought Process:  Coherent  Orientation:  Full (Time, Place, and Person)  Thought Content: Logical   Suicidal Thoughts:  {ST/HT (PAA):22692}  Homicidal Thoughts:  {ST/HT (PAA):22692}  Memory:  Immediate;   Good  Judgement:  {Judgement (PAA):22694}  Insight:  {Insight (PAA):22695}  Psychomotor Activity:  Normal  Concentration:   Concentration: Good and Attention Span: Good  Recall:  Good  Fund of Knowledge: Good  Language: Good  Akathisia:  No  Handed:  Right  AIMS (if indicated): not done  Assets:  Communication Skills Desire for Improvement  ADL's:  Intact  Cognition: WNL  Sleep:  {BHH GOOD/FAIR/POOR:22877}   Screenings: AIMS    Flowsheet Row Admission (Discharged) from 06/25/2016 in [facility]  AIMS Total Score 0      GAD-7    Flowsheet Row Office Visit from 12/22/2023 in [facility] Office Visit from 10/23/2023 in [facility] Office Visit from 10/24/2022 in [facility]  Total GAD-7 Score 4 13 7       PHQ2-9    Flowsheet Row Office Visit from 12/22/2023 in [facility] Office Visit from 10/23/2023 in [facility] Office Visit from 10/24/2022 in [facility]  PHQ-2 Total Score 2 2 3   PHQ-9 Total Score 9 16 14       Flowsheet Row Office Visit from 10/23/2023 in [facility] Office Visit from 10/24/2022 in [facility] ED from 09/26/2022 in [facility]   C-SSRS RISK CATEGORY Error: Q3, 4, or 5 should not be populated when Q2 is No Error: Q3, 4, or 5 should not be populated when Q2 is No No Risk        Assessment and Plan:  JANNETT SCHMALL is a 29 y.o. year old female with a history of bipolar disorder, likely substance induced, marijuana use disorder, polysubstance use disorder, GERD, who is referred for bipolar disorder.    1. Unspecified mood (affective) disorder (HCC) -R/o bipolar II disorder R/o mixed features R/o PTSD Acute stressors include: job, school work Other stressors include: conflict with her mother, absence of  nurturing in childhood, miscarriage in 2023   History: OD ibuprofen at age 39, OD xanax at age 31 in the context of drug use  Exam is notable for less rapid speech, and she continues to demonstrate a linear thought process and calm demeanor.  Although she had adverse reaction of weight gain, worsening in mood from Abilify, she reports good benefit from [place], feeling more regulated.  While she experiences mild depressive symptoms and anxiety, it has been more manageable, and she denies hypomanic symptoms since being on this medication.  While there is a concern of weight gain, it is unclear whether this is secondary to Lexapro, or with combination of Latuda.  She is willing to work on diet and exercise, and consider adjustment of her medication if she continues to have weight gain.  Will continue Latuda for mood dysregulation, along with Lexapro for depression and anxiety.  Will continue hydroxyzine as needed for anxiety.    2. Inattention She has a daughter, who is diagnosed with ADHD.  She also struggles with inattention, and would like to proceed with evaluation.  Make referral given comorbidity of bipolar disorder.    3. High risk medication use - Lipid panel - Basic metabolic panel The above test is ordered.  Will consider checking EKG after the next visit to monitor QTc prolongation.  Noted that she has no known cardiac disease.    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    Plan Continue lexapro 10 mg daily Continue latuda 20 mg daily - monitor weight, insomnia Continue hydroxyzine 25 mg daily as needed for severe anxiety  Obtain lab (TSH, lipid, BMP) at [facility] Referral for evaluation of ADHD Next appointment: 2/17 at 10 AM         Past trials of medication- Quetiapine (Zombie), lamotrigine (rash), Latuda (less emotions)   The  patient demonstrates the following risk factors for suicide: Chronic risk factors for suicide include: psychiatric disorder of depression, substance use disorder, and previous suicide attempts of overdosing medication . Acute risk  factors for suicide include: family or marital conflict and unemployment. Protective factors for this patient include: responsibility to others (children, family) and hope for the future. Considering these factors, the overall suicide risk at this point appears to be low. Patient is appropriate for outpatient follow up.     Collaboration of Care: Collaboration of Care: {BH OP Collaboration of Care:21014065}  Patient/Guardian was advised Release of Information must be obtained prior to any record release in order to collaborate their care with an outside provider. Patient/Guardian was advised if they have not already done so to contact the registration department to sign all necessary forms in order for [place] to release information regarding their care.   Consent: Patient/Guardian gives verbal consent for treatment and assignment of benefits for services provided during this visit. Patient/Guardian expressed understanding and agreed to proceed.    Neysa Hotter, MD 02/03/2024, 10:51 AM

## 2024-02-04 ENCOUNTER — Ambulatory Visit: Payer: Medicaid Other | Admitting: Obstetrics

## 2024-02-04 NOTE — Progress Notes (Deleted)
    GYNECOLOGY OFFICE PROCEDURE NOTE  [name] is a 28 y.o. G1P1001 here for *** IUD removal. No GYN concerns.  Last pap smear was on 04/27/21 and was normal. NILM HPV not tested  IUD Removal  Patient identified, informed consent performed, consent signed.  Patient was in the dorsal lithotomy position, normal external genitalia was noted.  A speculum was placed in the patient's vagina, normal discharge was noted, no lesions. The cervix was visualized, no lesions, no abnormal discharge.  The strings of the IUD were grasped and pulled using ring forceps. The IUD was removed in its entirety. *** (The strings of the IUD were not visualized, so Kelly forceps were introduced into the endometrial cavity and the IUD was grasped and removed in its entirety).  Patient tolerated the procedure well.    Patient will use *** for contraception/***plans for pregnancy soon and she was told to avoid teratogens, take PNV and folic acid.  Routine preventative health maintenance measures emphasized.    Cornelius Moras, CMA [facility]

## 2024-02-09 ENCOUNTER — Ambulatory Visit: Payer: Self-pay | Admitting: Psychiatry

## 2024-02-09 ENCOUNTER — Other Ambulatory Visit: Payer: Self-pay | Admitting: Psychiatry

## 2024-02-09 ENCOUNTER — Telehealth: Payer: Self-pay | Admitting: Psychiatry

## 2024-02-09 MED ORDER — LURASIDONE HCL 20 MG PO TABS
20.0000 mg | ORAL_TABLET | Freq: Every day | ORAL | 0 refills | Status: DC
Start: 2024-04-03 — End: 2024-03-16

## 2024-02-09 NOTE — Telephone Encounter (Signed)
 Ordered to be picked up in April.

## 2024-02-12 ENCOUNTER — Encounter: Payer: Self-pay | Admitting: Obstetrics

## 2024-03-10 ENCOUNTER — Telehealth: Payer: Self-pay | Admitting: Psychiatry

## 2024-03-10 NOTE — Telephone Encounter (Signed)
 I called the patient in response to her message to the front desk. She reports feeling stressed, with her mind racing at 100 mph, experiencing racing thoughts, and exhaustion. She believes this is situational, as she recently learned about court-related issues and is also feeling overwhelmed with school and work. While she experienced fleeting suicidal ideation, she adamantly denies any plan or intent.  At this time, she is not interested in medication adjustments but is willing to reach out to emergency resources if her symptoms worsen. She primarily wanted to ensure she has a follow-up appointment and agrees to contact the office if any concerns arise.
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
## 2024-03-16 ENCOUNTER — Other Ambulatory Visit: Payer: Self-pay

## 2024-03-16 ENCOUNTER — Ambulatory Visit (INDEPENDENT_AMBULATORY_CARE_PROVIDER_SITE_OTHER): Admitting: Psychiatry

## 2024-03-16 ENCOUNTER — Encounter: Payer: Self-pay | Admitting: Psychiatry

## 2024-03-16 VITALS — BP 137/92 | HR 92 | Temp 97.2°F | Ht 64.0 in | Wt 179.6 lb

## 2024-03-16 DIAGNOSIS — F1220 Cannabis dependence, uncomplicated: Secondary | ICD-10-CM | POA: Diagnosis not present

## 2024-03-16 DIAGNOSIS — F411 Generalized anxiety disorder: Secondary | ICD-10-CM | POA: Diagnosis not present

## 2024-03-16 DIAGNOSIS — Z79899 Other long term (current) drug therapy: Secondary | ICD-10-CM

## 2024-03-16 DIAGNOSIS — F3181 Bipolar II disorder: Secondary | ICD-10-CM | POA: Diagnosis not present

## 2024-03-16 MED ORDER — HYDROXYZINE HCL 25 MG PO TABS
25.0000 mg | ORAL_TABLET | Freq: Every day | ORAL | 0 refills | Status: DC | PRN
Start: 2024-03-16 — End: 2024-05-27

## 2024-03-16 MED ORDER — LURASIDONE HCL 40 MG PO TABS: 40.0000 mg | ORAL_TABLET | Freq: Every day | ORAL | 0 refills | Status: DC

## 2024-03-16 NOTE — Patient Instructions (Addendum)
 Increase latuda 40 mg daily  Hold lexapro Continue hydroxyzine 25 mg daily as needed for severe anxiety  Obtain lab (TSH, lipid, BMP) at [facility] Obtain EKG - please call [phone] to make an appointment  Referral for evaluation of ADHD Next appointment: 5/1 at 2:30

## 2024-03-17 ENCOUNTER — Telehealth: Payer: Self-pay

## 2024-03-17 ENCOUNTER — Encounter: Payer: Self-pay | Admitting: Psychology

## 2024-03-17 NOTE — Telephone Encounter (Signed)
 left message asking to call our office back. adhd testing office was suppose to call her and set up an appt even thou they are schedule out until the end of the year.

## 2024-03-17 NOTE — Telephone Encounter (Signed)
-----   Message from [place] sent at 03/16/2024  5:16 PM EDT ----- Regarding: adhd referral Referral was made for evaluation of ADHD. Could you ask where she is in the process? thanks

## 2024-03-17 NOTE — Telephone Encounter (Signed)
 called to check on the status of the referral. states that they will call pt to set up an appt but it will be the end of the year before she can be seen.

## 2024-03-17 NOTE — Telephone Encounter (Signed)
 Thanks. Could you notify the patient if that is not done yet? Thanks.

## 2024-03-18 NOTE — Telephone Encounter (Signed)
 left another message with info.

## 2024-03-19 NOTE — Telephone Encounter (Signed)
 pt called front desk pt has appt.

## 2024-03-19 NOTE — Telephone Encounter (Signed)
-----   Message from [place] sent at 03/16/2024  5:16 PM EDT ----- Regarding: adhd referral Referral was made for evaluation of ADHD. Could you ask where she is in the process? thanks

## 2024-04-05 ENCOUNTER — Ambulatory Visit: Payer: Self-pay | Admitting: Psychiatry
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
## 2024-04-22 ENCOUNTER — Encounter: Payer: Self-pay | Admitting: Psychiatry

## 2024-04-22 ENCOUNTER — Other Ambulatory Visit: Payer: Self-pay

## 2024-04-22 ENCOUNTER — Ambulatory Visit (INDEPENDENT_AMBULATORY_CARE_PROVIDER_SITE_OTHER): Payer: Self-pay | Admitting: Psychiatry

## 2024-04-22 VITALS — BP 143/98 | HR 115 | Temp 98.6°F | Ht 64.0 in | Wt 178.4 lb

## 2024-04-22 DIAGNOSIS — G4700 Insomnia, unspecified: Secondary | ICD-10-CM

## 2024-04-22 DIAGNOSIS — F411 Generalized anxiety disorder: Secondary | ICD-10-CM | POA: Diagnosis not present

## 2024-04-22 DIAGNOSIS — Z79899 Other long term (current) drug therapy: Secondary | ICD-10-CM

## 2024-04-22 DIAGNOSIS — R000 Tachycardia, unspecified: Secondary | ICD-10-CM

## 2024-04-22 DIAGNOSIS — F3181 Bipolar II disorder: Secondary | ICD-10-CM

## 2024-04-22 DIAGNOSIS — F1220 Cannabis dependence, uncomplicated: Secondary | ICD-10-CM

## 2024-04-22 MED ORDER — TRAZODONE HCL 50 MG PO TABS: 25.0000 mg | ORAL_TABLET | Freq: Every day | ORAL | 1 refills | Status: DC

## 2024-04-22 NOTE — Patient Instructions (Signed)
 Hold latuda , hydroxyzine   Increase latuda  40 mg daily  Start trazodone  25-50 mg at night as needed for insomnia Obtain lab (TSH, lipid, BMP) at [facility] Referred for evaluation of ADHD Referred for therapy Next appointment: 6/2 at 3:30

## 2024-05-18 ENCOUNTER — Other Ambulatory Visit: Payer: Self-pay | Admitting: Psychiatry

## 2024-05-19 NOTE — Progress Notes (Deleted)
 BH MD/PA/NP OP Progress Note  05/19/2024 5:32 PM Holly Farrell  MRN:  [id number]  Chief Complaint: No chief complaint on file.  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  Past Psychiatric History: Please see initial evaluation for full details. I have reviewed the history. No updates at this time.     Past Medical History:  Past Medical History:  Diagnosis Date   Acne 01/28/2017   Acute drug intoxication with perceptual disturbance (HCC) 09/13/2014   Anxiety    Bipolar disorder (HCC)    Bipolar II disorder (HCC) 06/25/2016   Depression    Family history of ovarian cancer    GERD (gastroesophageal reflux disease) 01/28/2017   Hemorrhoid 01/16/2018   History of head injury 09/23/2014   Overview:  Patient had TV fall on her head at age 74   Mania (monopolar) single episode or unspecified 09/23/2014   Overview:  Unclear if substance induced or true Bipolar Type I   Sedative, hypnotic or anxiolytic use disorder, mild, 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 I have reviewed the history. No updates at this time.     Family History:  Family History  Problem Relation Age of Onset   Depression Mother    Anxiety disorder Mother    Bipolar disorder Mother    Hypertension Mother    Asthma  Father    Alcohol abuse Father    Sleep disorder Brother    Hypertension Maternal Grandmother    Bipolar disorder Maternal Grandfather    Ovarian cancer Paternal Grandmother     Social History:  Social History   Socioeconomic History   Marital status: Single    Spouse name: Not on file   Number of children: 1   Years of education: Not on file   Highest education level: Some college, no degree  Occupational History   Not on file  Tobacco Use   Smoking status: Former    Current packs/day: 0.10    Average packs/day: 0.1 packs/day for 9.6 years (1.0 ttl pk-yrs)    Types: E-cigarettes, Cigarettes    Start date: 10/03/2014   Smokeless tobacco: Never  Vaping Use   Vaping status: Former  Substance and Sexual Activity   Alcohol use: Yes    Alcohol/week: 5.0 - 7.0 standard drinks of alcohol    Types: 2 - 3 Glasses of wine, 3 - 4 Cans of beer per week    Comment: 2-3 times per month   Drug use: Yes    Frequency: 1.0 times per week    Types: Marijuana    Comment: only using marijuana- other drugs no longer using   Sexual activity: Yes    Partners: Male  Birth control/protection: Condom  Other Topics Concern   Not on file  Social History Narrative   Not on file   Social Drivers of Health   Financial Resource Strain: Low Risk  (08/04/2023)   Received from [facility]   Overall Financial Resource Strain (CARDIA)    Difficulty of Paying Living Expenses: Not very hard  Food Insecurity: Food Insecurity Present (08/04/2023)   Received from [facility]   Hunger Vital Sign    Worried About Running Out of Food in the Last Year: Sometimes true    Ran Out of Food in the Last Year: Sometimes true  Transportation Needs: No Transportation Needs (08/04/2023)   Received from [facility] - Transportation    In the past 12 months, has lack of transportation kept you from medical appointments or from getting medications?: No     Lack of Transportation (Non-Medical): No  Physical Activity: Inactive (05/08/2018)   Exercise Vital Sign    Days of Exercise per Week: 0 days    Minutes of Exercise per Session: 0 min  Stress: Not on file  Social Connections: Not on file    Allergies:  Allergies  Allergen Reactions   Hydrocodone Hives   Latex Swelling    Swelling of lips and rash   Lamictal [Lamotrigine] Rash   Tramadol Rash    Metabolic Disorder Labs: Lab Results  Component Value Date   HGBA1C 5.1 06/26/2016   Lab Results  Component Value Date   PROLACTIN 43.9 (H) 06/26/2016   Lab Results  Component Value Date   CHOL 220 (H) 06/26/2016   TRIG 102 06/26/2016   HDL 65 06/26/2016   CHOLHDL 3.4 06/26/2016   VLDL 20 06/26/2016   LDLCALC 135 (H) 06/26/2016   LDLCALC 153 (H) 10/30/2015   Lab Results  Component Value Date   TSH 3.386 06/26/2016    Therapeutic Level Labs: No results found for: "LITHIUM" No results found for: "VALPROATE" No results found for: "CBMZ"  Current Medications: Current Outpatient Medications  Medication Sig Dispense Refill   hydrOXYzine  (ATARAX ) 25 MG tablet Take 1 tablet (25 mg total) by mouth daily as needed for anxiety. (Patient not taking: Reported on 04/22/2024) 90 tablet 0   lurasidone  (LATUDA ) 40 MG TABS tablet Take 1 tablet (40 mg total) by mouth daily with breakfast. (Patient not taking: Reported on 04/22/2024) 90 tablet 0   traZODone  (DESYREL ) 50 MG tablet Take 0.5-1 tablets (25-50 mg total) by mouth at bedtime as needed for sleep. 90 tablet 0   No current facility-administered medications for this visit.     Musculoskeletal: Strength & Muscle Tone: within normal limits Gait & Station: normal Patient leans: N/A  Psychiatric Specialty Exam: Review of Systems  There were no vitals taken for this visit.There is no height or weight on file to calculate BMI.  General Appearance: {Appearance:22683}  Eye Contact:  {BHH EYE CONTACT:22684}  Speech:  Clear and  Coherent  Volume:  Normal  Mood:  {BHH MOOD:22306}  Affect:  {Affect (PAA):22687}  Thought Process:  Coherent  Orientation:  Full (Time, Place, and Person)  Thought Content: Logical   Suicidal Thoughts:  {ST/HT (PAA):22692}  Homicidal Thoughts:  {ST/HT (PAA):22692}  Memory:  Immediate;   Good  Judgement:  {Judgement (PAA):22694}  Insight:  {Insight (PAA):22695}  Psychomotor Activity:  Normal  Concentration:  Concentration: Good and Attention Span: Good  Recall:  Good  Fund of Knowledge: Good  Language: Good  Akathisia:  No  Handed:  Right  AIMS (if indicated): not done  Assets:  Communication Skills Desire for Improvement  ADL's:  Intact  Cognition: WNL  Sleep:  {BHH GOOD/FAIR/POOR:22877}   Screenings: AIMS    Flowsheet Row Admission (Discharged) from 06/25/2016 in [facility]  AIMS Total Score 0      GAD-7    Flowsheet Row Office Visit from 04/22/2024 in [facility] Office Visit from 12/22/2023 in [facility] Office Visit from 10/23/2023 in [facility] Office Visit from 10/24/2022 in [facility]  Total GAD-7 Score 14 4 13 7       PHQ2-9    Flowsheet Row Office Visit from 04/22/2024 in [facility] Office Visit from 12/22/2023 in [facility] Office Visit from 10/23/2023 in [facility] Office Visit from 10/24/2022 in [facility]  PHQ-2 Total Score 3 2 2 3   PHQ-9 Total Score 16 9 16 14       Flowsheet Row Office Visit from 04/22/2024 in [facility] Office Visit from 10/23/2023 in [facility] Office Visit from 10/24/2022 in [facility]  C-SSRS RISK CATEGORY Error: Question 6 not populated Error: Q3, 4, or 5 should not be populated when Q2 is No Error: Q3, 4, or 5 should not be populated when Q2 is No        Assessment and Plan:  XAN SPARKMAN is a 28 y.o. year old female with a history of bipolar disorder, likely substance induced, marijuana use disorder, polysubstance use disorder, GERD, who is referred for bipolar disorder.    1. Bipolar II disorder (HCC) 2. Anxiety state R/o mixed features R/o PTSD Acute stressors include: job, school work, abortion in Jan 2025, has found out issues with court payment Other stressors include: conflict with her mother, absence of nurturing in childhood, miscarriage in 2023   History: OD ibuprofen at age 86, OD xanax at age 39 in the context of drug use  Unstable. She continued to experience significant racing thoughts, anxiety, depressive symptoms, while the exam is notable for slightly slower speech than previously observed.  Although she initially reported good benefit from Latuda , she now feels that the higher dose is no longer effective, and her family is the same according to the patient.  Although she will benefit from switching to other antipsychotics, there is a concern of tachycardia.  Will hold treatment until she is evaluated by her primary care for this condition.  She has an upcoming appointment for therapy with Ms. Deetta Farrow.    3. Tachycardia Newly addressed.  She reports occasional palpitation.  According to the chart review, she has not had this issues at her previous visit.  This coincided with uptitration of Latuda .  The medication will be discontinued due to perceived limited effectiveness.  Noted that she has taken Vyvanse, prescribed to her duaghter, although the last dose yesterday. She was advised to discontinue this medication.  She agrees to be seen by primary care for further evaluation.  Will also obtain lab to rule out medical health issues  contributing to her symptoms.    # Insomnia She continues to have initial and middle insomnia.  Although Latuda  was helping for insomnia, this medication will be discontinued due to the reason as above.  Will  start trazodone  as needed for insomnia.    4. High risk medication use She was recommended again to obtain blood test as she has been on antipsychotics.  Although this will be discontinued, we will obtain this at her baseline.    5. Marijuana dependence (HCC) # binge alcohol use   She has been abstinent from marijuana use.  Discussed potential long-term risk.  Will continue motivational interview.    Plan Hold latuda  (was on 40 mg daily), hydroxyzine   Start trazodone  25-50 mg at night as needed for insomnia Obtain lab (TSH, lipid, BMP) at [facility] Referred for evaluation of ADHD Referred for therapy Next appointment: 6/2 at 3:30, IP   Past trials of medication- Abilify , Quetiapine  (Zombie), lamotrigine (rash), Latuda  (less emotions)   The patient demonstrates the following risk factors for suicide: Chronic risk factors for suicide include: psychiatric disorder of depression, substance use disorder, and previous suicide attempts of overdosing medication . Acute risk factors for suicide include: family or marital conflict and unemployment. Protective factors for this patient include: responsibility to others (children, family) and hope for the future. Considering these factors, the overall suicide risk at this point appears to be low. Patient is appropriate for outpatient follow up.   Collaboration of Care: Collaboration of Care: {BH OP Collaboration of Care:21014065}  Patient/Guardian was advised Release of Information must be obtained prior to any record release in order to collaborate their care with an outside provider. Patient/Guardian was advised if they have not already done so to contact the registration department to sign all necessary forms in order for us  to release information  regarding their care.   Consent: Patient/Guardian gives verbal consent for treatment and assignment of benefits for services provided during this visit. Patient/Guardian expressed understanding and agreed to proceed.    Todd Fossa, MD 05/19/2024, 5:32 PM

## 2024-05-21 DIAGNOSIS — Z9151 Personal history of suicidal behavior: Secondary | ICD-10-CM | POA: Insufficient documentation

## 2024-05-21 DIAGNOSIS — F319 Bipolar disorder, unspecified: Secondary | ICD-10-CM | POA: Insufficient documentation

## 2024-05-21 DIAGNOSIS — R45851 Suicidal ideations: Secondary | ICD-10-CM | POA: Insufficient documentation

## 2024-05-21 DIAGNOSIS — F1290 Cannabis use, unspecified, uncomplicated: Secondary | ICD-10-CM | POA: Insufficient documentation

## 2024-05-21 NOTE — Progress Notes (Signed)
   05/21/24 2028  [facility] Triage Screening (Walk-ins at [facility] only)  How Did You Hear About Us ? Family/Friend  What Is the Reason for Your Visit/Call Today? Pt presents to [facility] as a voluntary walk-in, accompanied by her mother with complaint of SI, with no plan or intent. Pt reports feeling suicidal for a few weeks. Pt states " I just don't like myself right now". Pt reports diagnosis of Bipolar, anxiety and depression. Pt is currently prescribed Latuda , Trazadone adn Hydroxyzine  and is compliant at this time. Pt does believe that recent medication changes have made her feel differently. Pt also reports feeling paranoid at times, and believed that today someone was following her. Pt is established with Dr. Edda Goo (psychiatrist) and is supposed to begin outpatient therapy services next week. Pt currently denies HI,AVH and substance/alcohol use.  How Long Has This Been Causing You Problems? 1 wk - 1 month  Have You Recently Had Any Thoughts About Hurting Yourself? Yes  How long ago did you have thoughts about hurting yourself? currently  Are You Planning to Commit Suicide/Harm Yourself At This time? No  Have you Recently Had Thoughts About Hurting Someone [name]  Are You Planning To Harm Someone At This Time? No  Physical Abuse Denies  Verbal Abuse Denies  Sexual Abuse Denies  Exploitation of patient/patient's resources Denies  Self-Neglect Denies  Are you currently experiencing any auditory, visual or other hallucinations? No  Have You Used Any Alcohol or Drugs in the Past 24 Hours? No  Do you have any current medical co-morbidities that require immediate attention? No  Clinician description of patient physical appearance/behavior: Pt is cooperative, calm, tearful at times  What Do You Feel Would Help You the Most Today? Treatment for Depression or other mood problem  If access to [facility] was not available, would you have sought care in the Emergency Department? Yes  Determination of Need  Urgent (48 hours)  Options For Referral Other: Comment;Outpatient Therapy;BH Urgent Care;Medication Management;Inpatient Hospitalization  Determination of Need filed? Yes

## 2024-05-22 ENCOUNTER — Other Ambulatory Visit: Payer: Self-pay

## 2024-05-22 ENCOUNTER — Encounter (HOSPITAL_COMMUNITY): Payer: Self-pay | Admitting: Psychiatry

## 2024-05-22 ENCOUNTER — Ambulatory Visit (HOSPITAL_COMMUNITY)
Admission: EM | Admit: 2024-05-22 | Discharge: 2024-05-22 | Disposition: A | Discharge status: Other Acute Inpatient Hospital | Attending: Psychiatry | Admitting: Psychiatry

## 2024-05-22 ENCOUNTER — Inpatient Hospital Stay (HOSPITAL_COMMUNITY)
Admission: AD | Admit: 2024-05-22 | Discharge: 2024-05-27 | DRG: 885 | Disposition: A | Discharge status: Home / Self Care | Source: Intra-hospital | Attending: Psychiatry | Admitting: Psychiatry

## 2024-05-22 DIAGNOSIS — F1491 Cocaine use, unspecified, in remission: Secondary | ICD-10-CM | POA: Diagnosis not present

## 2024-05-22 DIAGNOSIS — Z885 Allergy status to narcotic agent status: Secondary | ICD-10-CM

## 2024-05-22 DIAGNOSIS — R45851 Suicidal ideations: Secondary | ICD-10-CM | POA: Diagnosis present

## 2024-05-22 DIAGNOSIS — Z79899 Other long term (current) drug therapy: Secondary | ICD-10-CM

## 2024-05-22 DIAGNOSIS — Z9152 Personal history of nonsuicidal self-harm: Secondary | ICD-10-CM | POA: Diagnosis not present

## 2024-05-22 DIAGNOSIS — Z87891 Personal history of nicotine dependence: Secondary | ICD-10-CM

## 2024-05-22 DIAGNOSIS — Z9151 Personal history of suicidal behavior: Secondary | ICD-10-CM | POA: Diagnosis not present

## 2024-05-22 DIAGNOSIS — Z825 Family history of asthma and other chronic lower respiratory diseases: Secondary | ICD-10-CM | POA: Diagnosis not present

## 2024-05-22 DIAGNOSIS — F314 Bipolar disorder, current episode depressed, severe, without psychotic features: Secondary | ICD-10-CM | POA: Diagnosis not present

## 2024-05-22 DIAGNOSIS — F419 Anxiety disorder, unspecified: Secondary | ICD-10-CM | POA: Diagnosis not present

## 2024-05-22 DIAGNOSIS — K5900 Constipation, unspecified: Secondary | ICD-10-CM | POA: Diagnosis not present

## 2024-05-22 DIAGNOSIS — Z814 Family history of other substance abuse and dependence: Secondary | ICD-10-CM | POA: Diagnosis not present

## 2024-05-22 DIAGNOSIS — Z8249 Family history of ischemic heart disease and other diseases of the circulatory system: Secondary | ICD-10-CM

## 2024-05-22 DIAGNOSIS — Z91014 Allergy to mammalian meats: Secondary | ICD-10-CM | POA: Diagnosis not present

## 2024-05-22 DIAGNOSIS — F333 Major depressive disorder, recurrent, severe with psychotic symptoms: Secondary | ICD-10-CM

## 2024-05-22 DIAGNOSIS — Z8041 Family history of malignant neoplasm of ovary: Secondary | ICD-10-CM | POA: Diagnosis not present

## 2024-05-22 DIAGNOSIS — Z87828 Personal history of other (healed) physical injury and trauma: Secondary | ICD-10-CM

## 2024-05-22 DIAGNOSIS — Z818 Family history of other mental and behavioral disorders: Secondary | ICD-10-CM | POA: Diagnosis not present

## 2024-05-22 DIAGNOSIS — Z811 Family history of alcohol abuse and dependence: Secondary | ICD-10-CM

## 2024-05-22 DIAGNOSIS — Z888 Allergy status to other drugs, medicaments and biological substances status: Secondary | ICD-10-CM

## 2024-05-22 DIAGNOSIS — F1291 Cannabis use, unspecified, in remission: Secondary | ICD-10-CM | POA: Diagnosis not present

## 2024-05-22 LAB — CBC WITH DIFFERENTIAL/PLATELET
Abs Immature Granulocytes: 0.07 10*3/uL (ref 0.00–0.07)
Basophils Absolute: 0.1 10*3/uL (ref 0.0–0.1)
Basophils Relative: 0 %
Eosinophils Absolute: 0.5 10*3/uL (ref 0.0–0.5)
Eosinophils Relative: 3 %
HCT: 37.8 % (ref 36.0–46.0)
Hemoglobin: 12.9 g/dL (ref 12.0–15.0)
Immature Granulocytes: 1 %
Lymphocytes Relative: 26 %
Lymphs Abs: 3.8 10*3/uL (ref 0.7–4.0)
MCH: 28 pg (ref 26.0–34.0)
MCHC: 34.1 g/dL (ref 30.0–36.0)
MCV: 82 fL (ref 80.0–100.0)
Monocytes Absolute: 0.7 10*3/uL (ref 0.1–1.0)
Monocytes Relative: 5 %
Neutro Abs: 9.3 10*3/uL — ABNORMAL HIGH (ref 1.7–7.7)
Neutrophils Relative %: 65 %
Platelets: 369 10*3/uL (ref 150–400)
RBC: 4.61 MIL/uL (ref 3.87–5.11)
RDW: 13.3 % (ref 11.5–15.5)
WBC: 14.4 10*3/uL — ABNORMAL HIGH (ref 4.0–10.5)
nRBC: 0 % (ref 0.0–0.2)

## 2024-05-22 LAB — COMPREHENSIVE METABOLIC PANEL WITH GFR
ALT: 17 U/L (ref 0–44)
AST: 23 U/L (ref 15–41)
Albumin: 4.3 g/dL (ref 3.5–5.0)
Alkaline Phosphatase: 93 U/L (ref 38–126)
Anion gap: 13 (ref 5–15)
BUN: 15 mg/dL (ref 6–20)
CO2: 25 mmol/L (ref 22–32)
Calcium: 9.6 mg/dL (ref 8.9–10.3)
Chloride: 98 mmol/L (ref 98–111)
Creatinine, Ser: 0.8 mg/dL (ref 0.44–1.00)
GFR, Estimated: >60 mL/min (ref >60)
Glucose, Bld: 129 mg/dL — ABNORMAL HIGH (ref 70–99)
Potassium: 3.7 mmol/L (ref 3.5–5.1)
Sodium: 136 mmol/L (ref 135–145)
Total Bilirubin: 0.6 mg/dL (ref 0.0–1.2)
Total Protein: 6.9 g/dL (ref 6.5–8.1)

## 2024-05-22 LAB — POCT URINE DRUG SCREEN - MANUAL ENTRY (I-SCREEN)
POC Amphetamine UR: NOT DETECTED
POC Buprenorphine (BUP): NOT DETECTED
POC Cocaine UR: NOT DETECTED
POC Marijuana UR: NOT DETECTED
POC Methadone UR: NOT DETECTED
POC Methamphetamine UR: NOT DETECTED
POC Morphine: NOT DETECTED
POC Oxazepam (BZO): NOT DETECTED
POC Oxycodone UR: NOT DETECTED
POC Secobarbital (BAR): NOT DETECTED

## 2024-05-22 LAB — HEMOGLOBIN A1C
Hgb A1c MFr Bld: 5 % (ref 4.8–5.6)
Mean Plasma Glucose: 96.8 mg/dL

## 2024-05-22 LAB — POC URINE PREG, ED: Preg Test, Ur: NEGATIVE

## 2024-05-22 LAB — TSH: TSH: 7.039 u[IU]/mL — ABNORMAL HIGH (ref 0.350–4.500)

## 2024-05-22 LAB — T4, FREE: Free T4: 0.89 ng/dL (ref 0.61–1.12)

## 2024-05-22 MED ORDER — MAGNESIUM HYDROXIDE 400 MG/5ML PO SUSP: 30.0000 mL | Freq: Every day | ORAL | Status: DC | PRN

## 2024-05-22 MED ORDER — DIPHENHYDRAMINE HCL 50 MG PO CAPS: 50.0000 mg | ORAL_CAPSULE | Freq: Three times a day (TID) | ORAL | Status: DC | PRN

## 2024-05-22 MED ORDER — ALUM & MAG HYDROXIDE-SIMETH 200-200-20 MG/5ML PO SUSP: 30.0000 mL | ORAL | Status: DC | PRN

## 2024-05-22 MED ORDER — HYDROXYZINE HCL 25 MG PO TABS: 25.0000 mg | ORAL_TABLET | Freq: Three times a day (TID) | ORAL | Status: DC | PRN

## 2024-05-22 MED ORDER — DIPHENHYDRAMINE HCL 25 MG PO CAPS: 50.0000 mg | ORAL_CAPSULE | Freq: Three times a day (TID) | ORAL | Status: DC | PRN

## 2024-05-22 MED ORDER — DIPHENHYDRAMINE HCL 50 MG/ML IJ SOLN: 50.0000 mg | Freq: Three times a day (TID) | INTRAMUSCULAR | Status: DC | PRN

## 2024-05-22 MED ORDER — MIRTAZAPINE 15 MG PO TABS
15.0000 mg | ORAL_TABLET | Freq: Every day | ORAL | Status: DC
  Administered 2024-05-22: 15 mg via ORAL
  Filled 2024-05-22: qty 1

## 2024-05-22 MED ORDER — OLANZAPINE 10 MG IM SOLR: 5.0000 mg | Freq: Three times a day (TID) | INTRAMUSCULAR | Status: DC | PRN

## 2024-05-22 MED ORDER — NICOTINE 14 MG/24HR TD PT24
14.0000 mg | MEDICATED_PATCH | Freq: Every day | TRANSDERMAL | Status: DC
  Administered 2024-05-23 – 2024-05-26 (×4): 14 mg via TRANSDERMAL
  Filled 2024-05-22 (×5): qty 1

## 2024-05-22 MED ORDER — TRAZODONE HCL 50 MG PO TABS: 50.0000 mg | ORAL_TABLET | Freq: Every evening | ORAL | Status: DC | PRN

## 2024-05-22 MED ORDER — HALOPERIDOL 5 MG PO TABS: 5.0000 mg | ORAL_TABLET | Freq: Three times a day (TID) | ORAL | Status: DC | PRN

## 2024-05-22 MED ORDER — MIRTAZAPINE 15 MG PO TABS: 15.0000 mg | ORAL_TABLET | Freq: Every day | ORAL | Status: DC

## 2024-05-22 MED ORDER — ACETAMINOPHEN 325 MG PO TABS: 650.0000 mg | ORAL_TABLET | Freq: Four times a day (QID) | ORAL | Status: DC | PRN

## 2024-05-22 MED ORDER — OLANZAPINE 10 MG IM SOLR: 10.0000 mg | Freq: Three times a day (TID) | INTRAMUSCULAR | Status: DC | PRN

## 2024-05-22 MED ORDER — HALOPERIDOL LACTATE 5 MG/ML IJ SOLN: 10.0000 mg | Freq: Three times a day (TID) | INTRAMUSCULAR | Status: DC | PRN

## 2024-05-22 MED ORDER — NICOTINE POLACRILEX 2 MG MT GUM
2.0000 mg | CHEWING_GUM | OROMUCOSAL | Status: DC | PRN
  Administered 2024-05-22 – 2024-05-27 (×6): 2 mg via ORAL
  Filled 2024-05-22 (×2): qty 1

## 2024-05-22 MED ORDER — LORAZEPAM 2 MG/ML IJ SOLN: 2.0000 mg | Freq: Three times a day (TID) | INTRAMUSCULAR | Status: DC | PRN

## 2024-05-22 MED ORDER — HALOPERIDOL LACTATE 5 MG/ML IJ SOLN: 5.0000 mg | Freq: Three times a day (TID) | INTRAMUSCULAR | Status: DC | PRN

## 2024-05-22 NOTE — ED Provider Notes (Signed)
 [facility] Continuous Assessment Admission H&P  Date: 05/22/24 Patient Name: Holly Farrell MRN: [id number] Chief Complaint: " I dont want to be here anymore"  Diagnoses:  Final diagnoses:  MDD (major depressive disorder), recurrent, severe, with psychosis (HCC)    HPI: Holly Farrell 28 y.o., female patient presented to [facility] as a voluntary walk in accompanied by her mother with complaints of suicidal ideations.   Holly Farrell, is seen face to face by this provider and chart reviewed on 05/22/24.  On evaluation Holly Farrell reports that " I am not doing well." When asked to elaborate the patient stated " I don't want to be here anymore." " I think of way to kill myself like overdose or crashing my car. Patient reports two prior suicide attempts when she was around 26 and 28 years of age. Patient overdosed on pills resulting in inpatient  hospitalization. Patient reports she has a daughter that's 41 years old and stated " I wish she could have a different mother." Patient reports auditory (someone calling her name) and visual ( shadows moving) hallucinations.  Patient states " I am not experiencing AVH at this time." "This morning was the last time I heard or saw anything." Patient denies HI. Patient denies using any or medications or illicit substances. 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   Per collateral: The patient has a difficult time handling responsibilities. Her mother also reports that she believes the patient does smoke marijuana on occasion.   During evaluation Holly Farrell is sitting in no acute distress. She is alert & oriented x 4, calm, cooperative and attentive for this  assessment.  Her mood is dysthymic with congruent affect.  She has low speech, and depressed behaviors.  Objectively there is no evidence of psychosis/mania or delusional thinking. Patient does not appear to be responding to internal or external stimuli.  Patient is able to converse coherently, goal directed thoughts, no distractibility, or pre-occupation.  She also endorses suicidal/self-harm, psychosis, and paranoia.  Patient answered question appropriately.    Total Time spent with patient: 45 minutes  Musculoskeletal  Strength & Muscle Tone: within normal limits Gait & Station: normal Patient leans: N/A  Psychiatric Specialty Exam  Presentation General Appearance:  Appropriate for Environment  Eye Contact: Fair  Speech: Clear and Coherent  Speech Volume: Normal  Handedness: Right   Mood and Affect  Mood: Dysphoric  Affect: Congruent   Thought Process  Thought Processes: Coherent  Descriptions of Associations:Intact  Orientation:Full (Time, Place and Person)  Thought Content:Logical    Hallucinations:Hallucinations: Auditory; Visual Description of Auditory Hallucinations: a voice calling her name Description of Visual Hallucinations: shadows moving  Ideas of Reference:Paranoia  Suicidal Thoughts:Suicidal Thoughts: Yes, Active SI Active Intent and/or Plan: With Plan; With Means to Carry Out (Overdose or Crash car)  Homicidal Thoughts:Homicidal Thoughts: No   Sensorium  Memory: Immediate Good  Judgment: Poor  Insight: Fair   Art therapist  Concentration: Fair  Attention Span: Good  Recall: Fair  Fund of Knowledge: Fair  Language: Good   Psychomotor Activity  Psychomotor Activity: Psychomotor Activity: Normal   Assets  Assets: Communication Skills; Desire for Improvement  Sleep  Sleep: Sleep: Good Number of Hours of Sleep: 8   Nutritional Assessment (For OBS and FBC admissions only) Has the patient had a  weight loss or gain of 10 pounds or more in the last 3 months?: No Has the patient had a decrease in food intake/or appetite?: No Does the patient have dental problems?: No Does the patient have eating habits or behaviors that may be indicators of an eating disorder including binging or inducing vomiting?: No Has the patient recently lost weight without trying?: 0 Has the patient been eating poorly because of a decreased appetite?: 0 Malnutrition Screening Tool Score: 0    Physical Exam HENT:     Head: Normocephalic.     Nose: Nose normal.     Mouth/Throat:     Mouth: Mucous membranes are moist.  Eyes:     Extraocular Movements: Extraocular movements intact.  Cardiovascular:     Rate and Rhythm: Normal rate.  Pulmonary:     Effort: Pulmonary effort is normal.  Musculoskeletal:        General: Normal range of motion.     Cervical back: Normal range of motion.  Skin:    General: Skin is warm.  Neurological:     Mental Status: She is alert.    Review of Systems  Constitutional: Negative.   HENT: Negative.    Eyes: Negative.   Respiratory: Negative.    Cardiovascular: Negative.   Gastrointestinal: Negative.   Genitourinary: Negative.   Musculoskeletal: Negative.   Skin: Negative.   Neurological: Negative.   Psychiatric/Behavioral:  Positive for depression, hallucinations and suicidal ideas.     Blood pressure 129/83, pulse 73, temperature 97.8 F (36.6 C), temperature source Oral, resp. rate 18, SpO2 98%. There is no height or weight on file to calculate BMI.  Past Psychiatric History: Bipolar, anxiety, depression   Is the patient at risk to self? Yes  Has the patient been a risk to self in the past 6 months? No .    Has the patient been a risk to self within the distant past? Yes   Is the patient a risk to others? No   Has the patient been a risk to others in the past 6 months? No   Has the patient been a risk to others within the distant past? No   Past Medical  History: No  Family History: No pertinent family history  Social History: No pertinent social history  Last Labs:  No visits with results within 6 Month(s) from this visit.  Latest known visit with results is:  Appointment on 05/06/2022  Component Date Value Ref Range Status   hCG Quant 05/06/2022 344  mIU/mL Final   Comment:                      Female (Non-pregnant)    0 -     5                             (Postmenopausal)  0 -     8                      Female (Pregnant)                      Weeks of Gestation  3                6 -    71                              4               10 -   750                              5              217 -  7138                              6              158 - 31795                              7             3697 -163563                              8            32065 -149571                              9            63803 -151410                             10            46509 -186977                             12            27832 -210612                             14            13950 - 62530                             15            12039 - 70971                             16             9040 - 56451                             17             8175 - 55868                             18             8099 - 58176 Roche E  CLIA methodology     Allergies: Hydrocodone, Latex, Lamictal [lamotrigine], and Tramadol  Medications:  Facility Ordered Medications  Medication   acetaminophen  (TYLENOL ) tablet 650 mg   alum & mag hydroxide-simeth (MAALOX/MYLANTA) 200-200-20 MG/5ML suspension 30 mL   magnesium  hydroxide (MILK OF MAGNESIA) suspension 30 mL   haloperidol (HALDOL) tablet 5 mg   And   diphenhydrAMINE  (BENADRYL ) capsule 50 mg   OLANZapine (ZYPREXA) injection 5 mg   OLANZapine (ZYPREXA) injection 10 mg   hydrOXYzine  (ATARAX ) tablet 25 mg   traZODone  (DESYREL ) tablet 50 mg   PTA  Medications  Medication Sig   lurasidone  (LATUDA ) 40 MG TABS tablet Take 1 tablet (40 mg total) by mouth daily with breakfast. (Patient not taking: Reported on 04/22/2024)   hydrOXYzine  (ATARAX ) 25 MG tablet Take 1 tablet (25 mg total) by mouth daily as needed for anxiety. (Patient not taking: Reported on 04/22/2024)   traZODone  (DESYREL ) 50 MG tablet Take 0.5-1 tablets (25-50 mg total) by mouth at bedtime as needed for sleep.      Medical Decision Making  Patient poses a risk of imminent danger to self, or others.     Patient recommended for inpatient treatment/continuous observation for crisis stabilization, mood stabilization and medication management.     Recommendations  Based on my evaluation the patient does not appear to have an emergency medical condition. Patient recommended for inpatient admission.  Lyndzee Kliebert, NP 05/22/24  1:19 AM

## 2024-05-22 NOTE — ED Notes (Signed)
 Patient transferred to [facility]. Patient voices no concerns regarding plan. Mood fair upon transfer. Patient had no belongings in locker to return. Staff escorted patient to back sallyport for transport to her destination. Safety maintained.

## 2024-05-22 NOTE — Progress Notes (Signed)
 BHH/BMU LCSW Progress Note   05/22/2024    11:24 AM  Holly Farrell   [id number]   Type of Contact and Topic:  Psychiatric Bed Placement   Pt accepted to [facility]    Patient meets inpatient criteria per Robet Chiquito, NP    The attending provider will be Dr. Linnie Riches  Call report to [phone]    Inell Mandril, RN @ [facility] notified.     Pt scheduled  to arrive at [facility] TODAY.    Deloyd Handy, MSW, LCSW-A  11:25 AM 05/22/2024

## 2024-05-22 NOTE — BHH Group Notes (Signed)
 Psychoeducational Group Note  Date:  05/22/2024 Time:  2000  Group Topic/Focus:  Wrap up group  Participation Level: Did Not Attend  Participation Quality:  Not Applicable  Affect:  Not Applicable  Cognitive:  Not Applicable  Insight:  Not Applicable  Engagement in Group: Not Applicable  Additional Comments:  Did not attend.   Catharine Clock 05/22/2024, 9:27 PM

## 2024-05-22 NOTE — ED Notes (Signed)
 Pt sleeping at present, no distress noted.  Monitoring for safety.

## 2024-05-22 NOTE — Discharge Instructions (Signed)
 Please transfer patient to the [facility] for a higher level of care.

## 2024-05-22 NOTE — ED Provider Notes (Incomplete)
 [facility]/OBS ASAP Discharge Summary  Date and Time: 05/22/2024 11:46 AM  Name: Holly Farrell  MRN:  [id number]   Discharge Diagnoses:  Final diagnoses:  MDD (major depressive disorder), recurrent, severe, with psychosis (HCC)    Subjective: ***  Stay Summary: ***  Total Time spent with patient: {Time; 15 min - 8 hours:17441}  Past Psychiatric History: *** Past Medical History: *** Family History: *** Family Psychiatric History: *** Social History: *** Tobacco Cessation:  {Discharge tobacco cessation prescription:304700209}  Current Medications:  Current Facility-Administered Medications  Medication Dose Route Frequency Provider Last Rate Last Admin   acetaminophen  (TYLENOL ) tablet 650 mg  650 mg Oral Q6H PRN McLauchlin, Angela, NP       alum & mag hydroxide-simeth (MAALOX/MYLANTA) 200-200-20 MG/5ML suspension 30 mL  30 mL Oral Q4H PRN McLauchlin, Angela, NP       haloperidol (HALDOL) tablet 5 mg  5 mg Oral TID PRN McLauchlin, Angela, NP       And   diphenhydrAMINE  (BENADRYL ) capsule 50 mg  50 mg Oral TID PRN McLauchlin, Angela, NP       hydrOXYzine  (ATARAX ) tablet 25 mg  25 mg Oral TID PRN McLauchlin, Angela, NP       magnesium  hydroxide (MILK OF MAGNESIA) suspension 30 mL  30 mL Oral Daily PRN McLauchlin, Angela, NP       mirtazapine (REMERON) tablet 15 mg  15 mg Oral QHS McLauchlin, Angela, NP   15 mg at 05/22/24 0226   OLANZapine (ZYPREXA) injection 10 mg  10 mg Intramuscular TID PRN McLauchlin, Angela, NP       OLANZapine (ZYPREXA) injection 5 mg  5 mg Intramuscular TID PRN McLauchlin, Angela, NP       Current Outpatient Medications  Medication Sig Dispense Refill   hydrOXYzine  (ATARAX ) 25 MG tablet Take 1 tablet (25 mg total) by mouth daily as needed for anxiety. (Patient not taking: Reported on 04/22/2024) 90 tablet 0   lurasidone  (LATUDA ) 40 MG TABS tablet Take 1 tablet (40 mg total) by mouth daily with breakfast. (Patient not taking: Reported on 04/22/2024) 90 tablet 0    traZODone  (DESYREL ) 50 MG tablet Take 0.5-1 tablets (25-50 mg total) by mouth at bedtime as needed for sleep. 90 tablet 0    PTA Medications:  Facility Ordered Medications  Medication   acetaminophen  (TYLENOL ) tablet 650 mg   alum & mag hydroxide-simeth (MAALOX/MYLANTA) 200-200-20 MG/5ML suspension 30 mL   magnesium  hydroxide (MILK OF MAGNESIA) suspension 30 mL   haloperidol (HALDOL) tablet 5 mg   And   diphenhydrAMINE  (BENADRYL ) capsule 50 mg   OLANZapine (ZYPREXA) injection 5 mg   OLANZapine (ZYPREXA) injection 10 mg   hydrOXYzine  (ATARAX ) tablet 25 mg   mirtazapine (REMERON) tablet 15 mg   PTA Medications  Medication Sig   lurasidone  (LATUDA ) 40 MG TABS tablet Take 1 tablet (40 mg total) by mouth daily with breakfast. (Patient not taking: Reported on 04/22/2024)   hydrOXYzine  (ATARAX ) 25 MG tablet Take 1 tablet (25 mg total) by mouth daily as needed for anxiety. (Patient not taking: Reported on 04/22/2024)   traZODone  (DESYREL ) 50 MG tablet Take 0.5-1 tablets (25-50 mg total) by mouth at bedtime as needed for sleep.       04/22/2024    3:27 PM 12/22/2023   12:13 PM 10/23/2023   12:53 PM  Depression screen PHQ 2/9  Decreased Interest 2 1 1   Down, Depressed, Hopeless 1 1 1   PHQ - 2 Score 3 2 2   Altered  sleeping 2 2 3   Tired, decreased energy 2 1 2   Change in appetite 2 1 2   Feeling bad or failure about yourself  2 0 1  Trouble concentrating 2 2 2   Moving slowly or fidgety/restless 2 1 3   Suicidal thoughts 1 0 1  PHQ-9 Score 16 9 16   Difficult doing work/chores  Somewhat difficult Somewhat difficult    Flowsheet Row ED from 05/22/2024 in [facility] Office Visit from 04/22/2024 in [facility] Office Visit from 10/23/2023 in confidential department  C-SSRS RISK CATEGORY High Risk Error: Question 6 not populated Error: Q3, 4, or 5 should not be populated when Q2 is No       Musculoskeletal  Strength & Muscle  Tone: {desc; muscle tone:32375} Gait & Station: {PE GAIT ED ZOXW:96045} Patient leans: {Patient Leans:21022755}  Psychiatric Specialty Exam  Presentation  General Appearance:  Appropriate for Environment  Eye Contact: Fair  Speech: Clear and Coherent  Speech Volume: Normal  Handedness: Right   Mood and Affect  Mood: Dysphoric  Affect: Congruent   Thought Process  Thought Processes: Coherent  Descriptions of Associations:Intact  Orientation:Full (Time, Place and Person)  Thought Content:Logical  Diagnosis of Schizophrenia or Schizoaffective disorder in past: No    Hallucinations:Hallucinations: Auditory; Visual Description of Auditory Hallucinations: a voice calling her name Description of Visual Hallucinations: shadows moving  Ideas of Reference:Paranoia  Suicidal Thoughts:Suicidal Thoughts: Yes, Active SI Active Intent and/or Plan: With Plan; With Means to Carry Out (Overdose or Crash car)  Homicidal Thoughts:Homicidal Thoughts: No   Sensorium  Memory: Immediate Good  Judgment: Poor  Insight: Fair   Art therapist  Concentration: Fair  Attention Span: Good  Recall: Fair  Fund of Knowledge: Fair  Language: Good   Psychomotor Activity  Psychomotor Activity: Psychomotor Activity: Normal   Assets  Assets: Communication Skills; Desire for Improvement   Sleep  Sleep: Sleep: Good Number of Hours of Sleep: 8   Nutritional Assessment (For OBS and [facility]BC admissions only) Has the patient had a weight loss or gain of 10 pounds or more in the last 3 months?: No Has the patient had a decrease in food intake/or appetite?: No Does the patient have dental problems?: No Does the patient have eating habits or behaviors that may be indicators of an eating disorder including binging or inducing vomiting?: No Has the patient recently lost weight without trying?: 0 Has the patient been eating poorly because of a decreased appetite?:  0 Malnutrition Screening Tool Score: 0    Physical Exam  Physical Exam ROS Blood pressure 115/84, pulse 88, temperature 98.4 F (36.9 C), temperature source Oral, resp. rate 17, SpO2 100%. There is no height or weight on file to calculate BMI.  Demographic Factors:  {Demographic Factors:20662}  Loss Factors: {Loss Factors:20659}  Historical Factors: {Historical Factors:20660}  Risk Reduction Factors:   {Risk Reduction Factors:20661}  Continued Clinical Symptoms:  {Clinical Factors:22706}  Cognitive Features That Contribute To Risk:  {chl bhh Cognitive Features:304700251}    Suicide Risk:  {[facility] SUICIDE WUJW:11914}  Plan Of Care/Follow-up recommendations:  {[facility] DC FU RECOMMENDATIONS:22620}  Disposition: ***  Robet Chiquito, NP 05/22/2024, 11:46 AM

## 2024-05-22 NOTE — ED Notes (Signed)
 Placed call to [facility] lab to see if T4 free and T3 free can be run on blood drawn earlier this evening .  The lab reported they do have enough and will run the labs.

## 2024-05-22 NOTE — Progress Notes (Addendum)
 Admission Note:  28 yr old Caucasian female who presents voluntary an in no acute distress for the treatment of SI and depression and anxiety. Pt appears flat and depressed. Pt was calm and cooperative with admission process. Pt reports  passive SI but no plan or intent, "I don't have anything to hurt myself with". Pt stated she have feeling sad, depresses, lonely and not wanting to get to get OOB for the past weeks but contracts for safety upon admission. Pt also reports intermittent  A/V/H, I hear people calling my name and sometimes see people walking past me". 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 Skin was assessed and found to be clear of any abnormal marks apart from tattoos to ULE's bilaterally.  PT searched and no contraband found, POC and unit policies explained and understanding verbalized. Consents obtained. Food and fluids offered, and fluids accepted and after she was given a tour of the unit and shown her bedroom, she demanded to leave, "this is not going to work for me". Discharge process explain, pt unhappy and irritable and isolated to her room. Pt assigned to room 306-1 and placed on q 15 min rounds Pt goals initially was to gain self confident and be more positive.

## 2024-05-22 NOTE — Plan of Care (Signed)
   Problem: Nutrition: Goal: Adequate nutrition will be maintained Outcome: Progressing   Problem: Coping: Goal: Level of anxiety will decrease Outcome: Progressing   Problem: Elimination: Goal: Will not experience complications related to bowel motility Outcome: Progressing

## 2024-05-22 NOTE — BH Assessment (Signed)
 Comprehensive Clinical Assessment (CCA) Note  05/22/2024 Holly Farrell [id number]  Disposition: Holly McLauchlin, NP recommends inpatient treatment. CSW to seek placement.   The patient demonstrates the following risk factors for suicide: Chronic risk factors for suicide include: Bipolar II Disorder. Acute risk factors for suicide include: social withdrawal/isolation and Pt is suicidal with a plan. Protective factors for this patient include: positive social support. Considering these factors, the overall suicide risk at this point appears to be high. Patient is not appropriate for outpatient follow up.  Holly Farrell is a 28 year old female who presents voluntary and accompanied by her Farrell Holly Farrell, [phone].) Clinician asked the pt, "what brought you to the hospital?" Pt reports, she's suicidal, really sad, overwhelmed, has no pleasure doing anything over the last few weeks. Pt reports, this week her suicidal thoughts became intentional, her plan is to take a bunch of pills but will have to write a will first. Pt reports, she doesn't want to get out of bed, "everything sucks." Pt reports, she's had some paranoia this morning (05/21/2024) she thought someone was following her. Pt reports, she feels her daughter deserves a better Farrell. Pt denies, HI, self-injurious behaviors and access to weapons.    Pt reports, she was manic a few weeks ago, she did not think her Bipolar medications were working. Per psychiatrist told her to stop taking her Bipolar medications and prescribed her Trazodone  for sleep. Pt reports, she started back taking Latuda  about a week ago. Pt is linked to Holly Farrell for medication management. Pt has her first therapy session on scheduled for May 26, 2024. Pt reports, she has a follow up appointment with her psychiatrist on May 24, 2024.   Pt presents quiet, awake in causal attire with normal speech and eye contact. Pt's mood, affect was depressed. Pt's insight  was fair. Pt's judgement was poor.   Chief Complaint:  Chief Complaint  Patient presents with   suicidal ideation   Visit Diagnosis:  Bipolar II Disorder.   CCA Screening, Triage and Referral (STR)  Patient Reported Information How did you hear about us ? Family/Friend  What Is the Reason for Your Visit/Call Today? Pt presents to [facility] as a voluntary walk-in, accompanied by her Farrell with complaint of SI, with no plan or intent. Pt reports feeling suicidal for a few weeks. Pt states " I just don't like myself right now". Pt reports diagnosis of Bipolar, anxiety and depression. Pt is currently prescribed Latuda , Trazadone adn Hydroxyzine  and is compliant at this time. Pt does believe that recent medication changes have made her feel differently. Pt also reports feeling paranoid at times, and believed that today someone was following her. Pt is established with Holly Farrell (psychiatrist) and is supposed to begin outpatient therapy services next week. Pt currently denies HI,AVH and substance/alcohol use.  How Long Has This Been Causing You Problems? 1 wk - 1 month  What Do You Feel Would Help You the Most Today? Treatment for Depression or other mood problem   Have You Recently Had Any Thoughts About Hurting Yourself? Yes  Are You Planning to Commit Suicide/Harm Yourself At This time? No   Flowsheet Row ED from 05/22/2024 in [facility] Office Visit from 04/22/2024 in [facility] Office Visit from 10/23/2023 in [facility]  C-SSRS RISK CATEGORY Low Risk Error: Question 6 not populated Error: Q3, 4, or 5 should not be populated when Q2 is No  Have you Recently Had Thoughts About Hurting Someone Holly Farrell? No  Are You Planning to Harm Someone at This Time? No  Explanation: NA   Have You Used Any Alcohol or Drugs in the Past 24 Hours? No  How Long Ago Did You Use Drugs or  Alcohol? NA What Did You Use and How Much? Pt reports, she drinks sometimes.   Do You Currently Have a Therapist/Psychiatrist? Yes  Name of Therapist/Psychiatrist: Name of Therapist/Psychiatrist: Pt is linked to Holly Farrell for medication management. Pt has her first therapy session on scheduled for May 26, 2024.   Have You Been Recently Discharged From Any Office Practice or Programs? No  Explanation of Discharge From Practice/Program: NA    CCA Screening Triage Referral Assessment Type of Contact: Face-to-Face  Telemedicine Service Delivery:   Is this Initial or Reassessment?   Date Telepsych consult ordered in [facility]:    Time Telepsych consult ordered in [facility]:    Location of Assessment: [facility]  Provider Location: [facility] Assessment Services   Collateral Involvement: Holly Farrell, [phone]   Does Patient Have a Court Appointed Legal Guardian? No  Legal Guardian Contact Information: Pt is her own guardian.  Copy of Legal Guardianship Form: -- (Pt is her own guardian.)  Legal Guardian Notified of Arrival: -- (Pt is her own guardian.)  Legal Guardian Notified of Pending Discharge: -- (Pt is her own guardian.)  If Minor and Not Living with Parent(s), Who has Custody? Pt is an adult.  Is CPS involved or ever been involved? In the Past  Is APS involved or ever been involved? Never   Patient Determined To Be At Risk for Harm To Self or Others Based on Review of Patient Reported Information or Presenting Complaint? Yes, for Self-Harm  Method: Plan with intent and identified person  Availability of Means: Has close by  Intent: Clearly intends on inflicting harm that could cause death  Notification Required: No need or identified person  Additional Information for Danger to Others Potential: -- (NA)  Additional Comments for Danger to Others Potential: Pt denies.  Are There Guns or Other Weapons in Your Home? No  Types of Guns/Weapons: Pt  denies, access to weapons.  Are These Weapons Safely Secured?                            -- (NA)  Who Could Verify You Are Able To Have These Secured: NA  Do You Have any Outstanding Charges, Pending Court Dates, Parole/Probation? Pt denies, legal involvement.  Contacted To Inform of Risk of Harm To Self or Others: Other: Comment (NA)    Does Patient Present under Involuntary Commitment? No    [place] of Residence: [place]   Patient Currently Receiving the Following Services: Medication Management   Determination of Need: Urgent (48 hours)   Options For Referral: Other: Comment; Outpatient Therapy; [facility]; Medication Management; Inpatient Hospitalization     CCA Biopsychosocial Patient Reported Schizophrenia/Schizoaffective Diagnosis in Past: No   Strengths: Pt has strong supports.   Mental Health Symptoms Depression:  Difficulty Concentrating; Sleep (too much or little); Fatigue; Hopelessness; Increase/decrease in appetite; Tearfulness; Worthlessness (Isolation.)   Duration of Depressive symptoms: Duration of Depressive Symptoms: Greater than two weeks   Mania:  None   Anxiety:   Worrying   Psychosis:  -- (Paranoia.)   Duration of Psychotic symptoms:    Trauma:  None   Obsessions:  None  Compulsions:  None   Inattention:  Disorganized; Forgetful; Loses things   Hyperactivity/Impulsivity:  Fidgets with hands/feet   Oppositional/Defiant Behaviors:  None   Emotional Irregularity:  Potentially harmful impulsivity; Recurrent suicidal behaviors/gestures/threats   Other Mood/Personality Symptoms:  NA    Mental Status Exam Appearance and self-care  Stature:  Average   Weight:  Average weight   Clothing:  Casual   Grooming:  Normal   Cosmetic use:  None   Posture/gait:  Normal   Motor activity:  Not Remarkable   Sensorium  Attention:  Normal   Concentration:  Normal   Orientation:  X5   Recall/memory:  Normal   Affect and  Mood  Affect:  Depressed   Mood:  Depressed   Relating  Eye contact:  Normal   Facial expression:  Depressed   Attitude toward examiner:  Cooperative; Guarded   Thought and Language  Speech flow: Normal   Thought content:  Appropriate to Mood and Circumstances   Preoccupation:  None   Hallucinations:  Other (Comment) (Paranoia.)   Organization:  Coherent   [employer] of Knowledge:  Fair   Intelligence:  Average   Abstraction:  Normal   Judgement:  Poor   Reality Testing:  Adequate   Insight:  Fair   Decision Making:  Impulsive   Social Functioning  Social Maturity:  Isolates   Social Judgement:  Normal   Stress  Stressors:  Other (Comment) (Pt reports, she worries about "every little thing in the world.")   Coping Ability:  Overwhelmed   Skill Deficits:  Communication   Supports:  Family     Religion: Religion/Spirituality Are You A Religious Person?: Yes What is Your Religious Affiliation?: Christian How Might This Affect Treatment?: NA  Leisure/Recreation: Leisure / Recreation Do You Have Hobbies?: No  Exercise/Diet: Exercise/Diet Do You Exercise?: No Have You Gained or Lost A Significant Amount of Weight in the Past Six Months?: No Do You Follow a Special Diet?: No Do You Have Any Trouble Sleeping?: No (Pt reports, she has been sleeping more.)   CCA Employment/Education Employment/Work Situation: Employment / Work Situation Employment Situation: Employed Work Stressors: Pt reports, her Production designer, 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 Patient's Job has Been Impacted by Current Illness: No Has Patient ever Been in the [employer]?: No  Education: Education Is Patient Currently Attending School?: Yes School Currently Attending: Pt is currently attends [employer]. 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 Did You Have An  Individualized Education Program (IIEP): No Did You Have Any Difficulty At School?: No Patient's Education Has Been Impacted by Current Illness: No   CCA Family/Childhood History Family and Relationship History: Family history Marital status: Single Does patient have children?: Yes How many children?: 1 How is patient's relationship with their children?: Pt reports, her relationship with her daughter is good.  Childhood History:  Childhood History By whom was/is the patient raised?: Both parents Did patient suffer any verbal/emotional/physical/sexual abuse as a child?: No Did patient suffer from severe childhood neglect?: No Has patient ever been sexually abused/assaulted/raped as an adolescent or adult?: No Was the patient ever a victim of a crime or a disaster?: No Witnessed domestic violence?: No Has patient been affected by domestic violence as an adult?: No   CCA Substance Use Alcohol/Drug Use: Alcohol / Drug Use Pain Medications: See MAR Prescriptions: See MAR Over the Counter: See MAR  History of alcohol / drug use?: No history of alcohol / drug abuse Longest period of sobriety (when/how long): NA Negative Consequences of Use:  (NA) Withdrawal Symptoms: None    ASAM's:  Six Dimensions of Multidimensional Assessment  Dimension 1:  Acute Intoxication and/or Withdrawal Potential:      Dimension 2:  Biomedical Conditions and Complications:      Dimension 3:  Emotional, Behavioral, or Cognitive Conditions and Complications:     Dimension 4:  Readiness to Change:     Dimension 5:  Relapse, Continued use, or Continued Problem Potential:     Dimension 6:  Recovery/Living Environment:     ASAM Severity Score:    ASAM Recommended Level of Treatment:     Substance use Disorder (SUD)    Recommendations for Services/Supports/Treatments: Recommendations for Services/Supports/Treatments Recommendations For Services/Supports/Treatments: Inpatient  Hospitalization  Disposition Recommendation per psychiatric provider: We recommend inpatient psychiatric hospitalization when medically cleared. Patient is under voluntary admission status at this time; please IVC if attempts to leave hospital.   DSM5 Diagnoses: Patient Active Problem List   Diagnosis Date Noted   Anxiety 01/28/2017   Eczema 01/28/2017   Cannabis use disorder, moderate, dependence (HCC) 06/26/2016   Tobacco use disorder 06/25/2016     Referrals to Alternative Service(s): Referred to Alternative Service(s):   Place:   Date:   Time:    Referred to Alternative Service(s):   Place:   Date:   Time:    Referred to Alternative Service(s):   Place:   Date:   Time:    Referred to Alternative Service(s):   Place:   Date:   Time:     Rosi Converse, LCMHCComprehensive Clinical Assessment (CCA) Screening, Triage and Referral Note  05/22/2024 Holly Farrell [id number]  Chief Complaint:  Chief Complaint  Patient presents with   suicidal ideation   Visit Diagnosis:   Patient Reported Information How did you hear about us ? Family/Friend  What Is the Reason for Your Visit/Call Today? Pt presents to [facility] as a voluntary walk-in, accompanied by her Farrell with complaint of SI, with no plan or intent. Pt reports feeling suicidal for a few weeks. Pt states " I just don't like myself right now". Pt reports diagnosis of Bipolar, anxiety and depression. Pt is currently prescribed Latuda , Trazadone adn Hydroxyzine  and is compliant at this time. Pt does believe that recent medication changes have made her feel differently. Pt also reports feeling paranoid at times, and believed that today someone was following her. Pt is established with Holly Farrell (psychiatrist) and is supposed to begin outpatient therapy services next week. Pt currently denies HI,AVH and substance/alcohol use.  How Long Has This Been Causing You Problems? 1 wk - 1 month  What Do You Feel Would Help You the Most  Today? Treatment for Depression or other mood problem   Have You Recently Had Any Thoughts About Hurting Yourself? Yes  Are You Planning to Commit Suicide/Harm Yourself At This time? No   Have you Recently Had Thoughts About Hurting Someone Holly Farrell? No  Are You Planning to Harm Someone at This Time? No  Explanation: NA   Have You Used Any Alcohol or Drugs in the Past 24 Hours? No  How Long Ago Did You Use Drugs or Alcohol? NA What Did You Use and How Much? Pt reports, she drinks sometimes.   Do You Currently Have a Therapist/Psychiatrist? Yes  Name of Therapist/Psychiatrist: Pt is linked to Holly Farrell for medication management. Pt has her  first therapy session on scheduled for May 26, 2024.   Have You Been Recently Discharged From Any Office Practice or Programs? No  Explanation of Discharge From Practice/Program: No.   CCA Screening Triage Referral Assessment Type of Contact: Face-to-Face  Telemedicine Service Delivery:   Is this Initial or Reassessment?   Date Telepsych consult ordered in [facility]:    Time Telepsych consult ordered in [facility]:    Location of Assessment: [facility]  Provider Location: [facility] Assessment [facility]    Collateral Involvement: Holly Farrell, [phone]   Does Patient Have a Court Appointed Legal Guardian? No. Name and Contact of Legal Guardian: Pt is her own guardian.  If Minor and Not Living with Parent(s), Who has Custody? Pt is an adult.  Is CPS involved or ever been involved? In the Past  Is APS involved or ever been involved? Never   Patient Determined To Be At Risk for Harm To Self or Others Based on Review of Patient Reported Information or Presenting Complaint? Yes, for Self-Harm  Method: Plan with intent and identified person  Availability of Means: Has close by  Intent: Clearly intends on inflicting harm that could cause death  Notification Required: No need or identified person  Additional Information  for Danger to Others Potential: -- (NA)  Additional Comments for Danger to Others Potential: Pt denies.  Are There Guns or Other Weapons in Your Home? No  Types of Guns/Weapons: Pt denies, access to weapons.  Are These Weapons Safely Secured?                            -- (NA)  Who Could Verify You Are Able To Have These Secured: NA  Do You Have any Outstanding Charges, Pending Court Dates, Parole/Probation? Pt denies, legal involvement.  Contacted To Inform of Risk of Harm To Self or Others: Other: Comment (NA)   Does Patient Present under Involuntary Commitment? No    [place] of Residence: [place]   Patient Currently Receiving the Following Services: Medication Management   Determination of Need: Urgent (48 hours)   Options For Referral: Other: Comment; Outpatient Therapy; [facility]; Medication Management; Inpatient Hospitalization   Disposition Recommendation per psychiatric provider: We recommend inpatient psychiatric hospitalization when medically cleared. Patient is under voluntary admission status at this time; please IVC if attempts to leave hospital.  Rosi Converse, [facility]   Rosi Converse, MS, [facility], [facility] Triage Specialist [phone]

## 2024-05-22 NOTE — ED Notes (Signed)
 Call placed to [employer]. Advised to call back at 3pm.

## 2024-05-23 DIAGNOSIS — F314 Bipolar disorder, current episode depressed, severe, without psychotic features: Secondary | ICD-10-CM | POA: Diagnosis not present

## 2024-05-23 LAB — T3, FREE: T3, Free: 3.5 pg/mL (ref 2.0–4.4)

## 2024-05-23 MED ORDER — HYDROXYZINE HCL 50 MG PO TABS
50.0000 mg | ORAL_TABLET | Freq: Four times a day (QID) | ORAL | Status: DC | PRN
  Administered 2024-05-25: 50 mg via ORAL
  Filled 2024-05-23: qty 1

## 2024-05-23 MED ORDER — ARIPIPRAZOLE 5 MG PO TABS
5.0000 mg | ORAL_TABLET | Freq: Every day | ORAL | Status: AC
  Administered 2024-05-23 – 2024-05-24 (×2): 5 mg via ORAL
  Filled 2024-05-23 (×2): qty 1

## 2024-05-23 MED ORDER — ARIPIPRAZOLE 10 MG PO TABS
10.0000 mg | ORAL_TABLET | Freq: Every day | ORAL | Status: DC
  Administered 2024-05-25 – 2024-05-27 (×3): 10 mg via ORAL
  Filled 2024-05-23 (×3): qty 1

## 2024-05-23 MED ORDER — TRAZODONE HCL 50 MG PO TABS
50.0000 mg | ORAL_TABLET | Freq: Every day | ORAL | Status: DC
  Administered 2024-05-23: 50 mg via ORAL
  Filled 2024-05-23: qty 1

## 2024-05-23 NOTE — Group Note (Signed)
 Date:  05/23/2024 Time:  4:50 PM  Group Topic/Focus (Patients created a 'life collage')   Cultivating creativity can help synthesize knowledge, experiences, frustrations, and support our mental health   Participation Level:  Did Not Attend  Holly Farrell 05/23/2024, 4:50 PM

## 2024-05-23 NOTE — Plan of Care (Signed)
   Problem: Education: Goal: Emotional status will improve Outcome: Progressing   Problem: Activity: Goal: Interest or engagement in activities will improve Outcome: Progressing

## 2024-05-23 NOTE — Progress Notes (Signed)
   05/23/24 1200  Psych Admission Type (Psych Patients Only)  Admission Status Voluntary  Psychosocial Assessment  Patient Complaints None  Eye Contact Fair  Facial Expression Flat  Affect Anxious  Speech Logical/coherent  Interaction Assertive  Motor Activity Slow  Appearance/Hygiene Unremarkable  Behavior Characteristics Cooperative  Mood Depressed  Thought Process  Coherency WDL  Content WDL  Delusions None reported or observed  Perception WDL  Hallucination None reported or observed  Judgment Impaired  Confusion None  Danger to Self  Current suicidal ideation? Denies  Agreement Not to Harm Self Yes  Description of Agreement Verbal Consent  Danger to Others  Danger to Others None reported or observed

## 2024-05-23 NOTE — [facility] Counselor (Signed)
 Adult Comprehensive Assessment  Patient ID: Holly Farrell, female   DOB: February 10, 1996, 28 y.o.   MRN: [id number]  Information Source: Information source: Patient  Current Stressors:  Patient states their primary concerns and needs for treatment are:: The patient stated that she has SI Patient states their goals for this hospitilization and ongoing recovery are:: The patient stated that she want to leave Educational / Learning stressors: The patient this last semester was stressful Employment / Job issues: The patient stated that a little bit Family Relationships: The patient stated that her family stressor her out Financial / Lack of resources (include bankruptcy): The patient stated that not having money stresses her out too Housing / Lack of housing: none reported Physical health (include injuries & life threatening diseases): The patient worries about her weight Social relationships: The patient stated that sometimes its a triggers Substance abuse: none reported Bereavement / Loss: none reported  Living/Environment/Situation:  Living Arrangements: Parent Living conditions (as described by patient or guardian): The patient stated that its okay, stress when mom try to mother her granddaughter Who else lives in the home?: 3, the patient's mother, her daughter and the patient How long has patient lived in current situation?: 1.5 year What is atmosphere in current home: Comfortable, Supportive, Temporary  Family History:  Marital status: Single Are you sexually active?: Yes What is your sexual orientation?: heterosexual Has your sexual activity been affected by drugs, alcohol, medication, or emotional stress?: none reported Does patient have children?: Yes How many children?: 1 How is patient's relationship with their children?: Pt reports, her relationship with her daughter is good.  Childhood History:  By whom was/is the patient raised?: Both parents Additional childhood history  information: Describes childhood:  my parent split when I was 10.  He cheated on my mom.  My dad was in and out of my life growing up.  Born in [place] Description of patient's relationship with caregiver when they were a child: Mother: before the divorce everything was pretty ok.  After she divorced my dad she got crazy.  Father: good. Patient's description of current relationship with people who raised him/her: The patient stated thst it's fine How were you disciplined when you got in trouble as a child/adolescent?: I got away with stuff when I was a teenager since my parents were getting a divorce. I got spanking and placed in time out as a kid.  Does patient have siblings?: Yes Number of Siblings: 4 Description of patient's current relationship with siblings: We have a weird relationship.  We love each other but he doesn't like the person I have become.  We just to be really close. He is in college playing baseball. He is a Naval architect.  I am very proud of him. 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 Patient's Job has Been Impacted by Current  Illness: No What is the Longest Time Patient has Held a Job?: 45months-1year Where was the Patient Employed at that Time?: [employer] Has Patient ever Been in the [employer]?: No  Financial Resources:   Financial resources: Income from employment Does patient have a representative payee or guardian?: No  Alcohol/Substance Abuse:   What has been your use of drugs/alcohol within the last 12 months?: alcohol and smoke weed If attempted suicide, did drugs/alcohol play a role in this?: No Alcohol/Substance Abuse Treatment Hx: Denies past history Has alcohol/substance abuse ever caused legal problems?: No  Social Support System:   Patient's Community Support System: Good Describe Community Support System: The patient stated that her friends and fmaily check in on her Type of faith/religion: Christian How does patient's faith help to cope with current illness?: The patient stated that she pray  Leisure/Recreation:   Do You Have Hobbies?: Yes Leisure and Hobbies: read, art  Strengths/Needs:   What is the patient's perception of their strengths?: The patient stated that she is intelligent and multi tasker, quick thinker, problem Patient states they can use these personal strengths during their treatment to contribute to their recovery: The patient stated that she is able to  motivate me to learn and needs medication adn out patient therapy Patient states these barriers may affect/interfere with their treatment: The patient stated that "the people here are scary" Patient states these barriers may affect their return to the community: The patient stated that she does not know Other important information patient would like considered in planning for their treatment: None reported  Discharge Plan:   Currently receiving community mental health services: No Patient states concerns and preferences for aftercare planning are: The patient stated that she has therapy Patient states they will know when  they are safe and ready for discharge when: The patient stated that she is ready to go now Does patient have access to transportation?: Yes Does patient have financial barriers related to discharge medications?: No Patient description of barriers related to discharge medications: none reported Will patient be returning to same living situation after discharge?: Yes  Summary/Recommendations:   Summary and Recommendations (to be completed by the evaluator): Reshonda Koerber is a 28 year old Caucasian female single mother. The patient stated that she has a 55-year-old daughter and lives with her mother. The patient stated that she has a lot going on, "going to school, being a mother, and working.". The patient denies SI HI and AVH. The patient denies substance use and alcohol use at the time. The patient stated that she gets along with her mother "occasionally bump heads." The patient stated that she is stress out from school attending [employer])The patient stated that she did not mean to say that she was going to self-harm. The patient stated that she is ready to be discharged to go back to school. The patient stated that the summer school class started on Monday. The patient reports that she has a psychiatrist that told her to stop taking her medication and go back to her PCP. The patient stated that once she stop her medication she notice her mental health is spiraling. Patient will benefit from crisis stabilization, medication evaluation, group therapy and psychoeducation, in addition to case management for discharge planning. At discharge it is recommended that Patient adhere to the established discharge plan and continue  in treatment.  Halford Goetzke O Ayse Mccartin. 05/23/2024

## 2024-05-23 NOTE — [facility] Suicide Risk Assessment (Signed)
 [facility] Admission Suicide Risk Assessment   Nursing information obtained from:  Patient Demographic factors:  Caucasian Current Mental Status:  Suicidal ideation indicated by patient Loss Factors:  Loss of significant relationship Historical Factors:  Prior suicide attempts Risk Reduction Factors:  Responsible for children under 28 years of age, Sense of responsibility to family  Total Time spent with patient: 1 hour Principal Problem: Bipolar 1 disorder, depressed, severe (HCC) Diagnosis:  Principal Problem:   Bipolar 1 disorder, depressed, severe (HCC)  Subjective Data: See H&P  Continued Clinical Symptoms:  Alcohol Use Disorder Identification Test Final Score (AUDIT): 2 The "Alcohol Use Disorders Identification Test", Guidelines for Use in Primary Care, Second Edition.  World Science writer [facility]). Score between 0-7:  no or low risk or alcohol related problems. Score between 8-15:  moderate risk of alcohol related problems. Score between 16-19:  high risk of alcohol related problems. Score 20 or above:  warrants further diagnostic evaluation for alcohol dependence and treatment.   CLINICAL FACTORS:   Bipolar Disorder:   Depressive phase   Musculoskeletal: Strength & Muscle Tone: within normal limits Gait & Station: normal Patient leans: N/A  Psychiatric Specialty Exam:  Presentation  General Appearance:  Appropriate for Environment  Eye Contact: Fair  Speech: Clear and Coherent  Speech Volume: Normal  Handedness: Right   Mood and Affect  Mood: Dysphoric  Affect: Congruent   Thought Process  Thought Processes: Coherent  Descriptions of Associations:Intact  Orientation:Full (Time, Place and Person)  Thought Content:Logical  History of Schizophrenia/Schizoaffective disorder:No  Duration of Psychotic Symptoms:No data recorded Hallucinations:Hallucinations: Auditory; Visual Description of Auditory Hallucinations: a voice calling her  name Description of Visual Hallucinations: shadows moving  Ideas of Reference:Paranoia  Suicidal Thoughts:Suicidal Thoughts: Yes, Active SI Active Intent and/or Plan: With Plan; With Means to Carry Out (Overdose or Crash car)  Homicidal Thoughts:Homicidal Thoughts: No   Sensorium  Memory: Immediate Good  Judgment: Poor  Insight: Fair   Art therapist  Concentration: Fair  Attention Span: Good  Recall: Fair  Fund of Knowledge: Fair  Language: Good   Psychomotor Activity  Psychomotor Activity: Psychomotor Activity: Normal   Assets  Assets: Communication Skills; Desire for Improvement   Sleep  Sleep: Sleep: Good Number of Hours of Sleep: 8    Physical Exam: Physical Exam ROS Blood pressure 116/69, pulse 82, temperature 98.8 F (37.1 C), temperature source Oral, resp. rate 18, height 5\' 3"  (1.6 m), weight 79.8 kg, SpO2 99%. Body mass index is 31.18 kg/m.   COGNITIVE FEATURES THAT CONTRIBUTE TO RISK:  None    SUICIDE RISK:   Moderate:  Frequent suicidal ideation with limited intensity, and duration, some specificity in terms of plans, no associated intent, good self-control, limited dysphoria/symptomatology, some risk factors present, and identifiable protective factors, including available and accessible social support.  PLAN OF CARE: See H&P  I certify that inpatient services furnished can reasonably be expected to improve the patient's condition.   Ralphael Southgate Linnie Riches, MD 05/23/2024, 10:44 AM

## 2024-05-23 NOTE — Plan of Care (Signed)
 Pt presents with flat expression and depressed mood. Denies SI, HI, AVH, and pain at this time. Cooperative in interactions with staff. Pt did not attend evening group. Medication compliant with no adverse reactions. Safety checks maintained at q 15 minutes. Support, encouragement, and reassurance offered to the pt.   Problem: Education: Goal: Emotional status will improve Outcome: Progressing Goal: Mental status will improve Outcome: Progressing Goal: Verbalization of understanding the information provided will improve Outcome: Progressing   Problem: Activity: Goal: Interest or engagement in activities will improve Outcome: Progressing   Problem: Safety: Goal: Periods of time without injury will increase Outcome: Progressing
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
## 2024-05-23 NOTE — Group Note (Signed)
 Date:  05/23/2024 Time:  1:01 PM  Group Topic/Focus:   Setbacks in Recovery:   The focus of this group is to identify unhealthy thought patterns and develop a plan to handle them in a healthier way upon discharge.    Participation Level:  Minimal  Participation Quality:  Appropriate  Affect:  Appropriate  Cognitive:  Appropriate  Insight: Appropriate  Engagement in Group:  Developing/Improving  Modes of Intervention:  Discussion and Education  Additional Comments:    Sheryl Donna 05/23/2024, 1:01 PM

## 2024-05-23 NOTE — [facility] Group Notes (Signed)
 [facility] Group Notes:  (Nursing/MHT/Case Management/Adjunct)  Date:  05/23/2024  Time:  2000  Type of Therapy:  Wrap up group  Participation Level:  Active  Participation Quality:  Appropriate, Attentive, Sharing, and Supportive  Affect:  Depressed  Cognitive:  Alert  Insight:  Improving  Engagement in Group:  Engaged  Modes of Intervention:  Clarification, Education, and Support  Summary of Progress/Problems: Positive thinking and self-care were discussed.   Metta Actis S 05/23/2024, 9:22 PM

## 2024-05-23 NOTE — Group Note (Signed)
 Date:  05/23/2024 Time:  5:16 PM  Group Topic/Focus:  Goals Group:   The focus of this group is to help patients establish daily goals to achieve during treatment and discuss how the patient can incorporate goal setting into their daily lives to aide in recovery. Orientation:   The focus of this group is to educate the patient on the purpose and policies of crisis stabilization and provide a format to answer questions about their admission.  The group details unit policies and expectations of patients while admitted.    Participation Level:  Did Not Attend   Sheryl Donna 05/23/2024, 5:16 PM

## 2024-05-24 ENCOUNTER — Encounter (HOSPITAL_COMMUNITY): Payer: Self-pay

## 2024-05-24 ENCOUNTER — Ambulatory Visit: Admitting: Psychiatry

## 2024-05-24 DIAGNOSIS — F314 Bipolar disorder, current episode depressed, severe, without psychotic features: Secondary | ICD-10-CM | POA: Diagnosis not present

## 2024-05-24 LAB — CBC WITH DIFFERENTIAL/PLATELET
Abs Immature Granulocytes: 0.05 10*3/uL (ref 0.00–0.07)
Basophils Absolute: 0.1 10*3/uL (ref 0.0–0.1)
Basophils Relative: 1 %
Eosinophils Absolute: 0.5 10*3/uL (ref 0.0–0.5)
Eosinophils Relative: 5 %
HCT: 37.5 % (ref 36.0–46.0)
Hemoglobin: 12.6 g/dL (ref 12.0–15.0)
Immature Granulocytes: 1 %
Lymphocytes Relative: 27 %
Lymphs Abs: 3 10*3/uL (ref 0.7–4.0)
MCH: 28 pg (ref 26.0–34.0)
MCHC: 33.6 g/dL (ref 30.0–36.0)
MCV: 83.3 fL (ref 80.0–100.0)
Monocytes Absolute: 0.6 10*3/uL (ref 0.1–1.0)
Monocytes Relative: 5 %
Neutro Abs: 6.8 10*3/uL (ref 1.7–7.7)
Neutrophils Relative %: 61 %
Platelets: 310 10*3/uL (ref 150–400)
RBC: 4.5 MIL/uL (ref 3.87–5.11)
RDW: 13.5 % (ref 11.5–15.5)
WBC: 11 10*3/uL — ABNORMAL HIGH (ref 4.0–10.5)
nRBC: 0 % (ref 0.0–0.2)

## 2024-05-24 LAB — TSH: TSH: 2.764 u[IU]/mL (ref 0.350–4.500)

## 2024-05-24 LAB — T4, FREE: Free T4: 0.71 ng/dL (ref 0.61–1.12)

## 2024-05-24 LAB — LIPID PANEL
Cholesterol: 184 mg/dL (ref 0–200)
HDL: 34 mg/dL — ABNORMAL LOW (ref >40)
LDL Cholesterol: 78 mg/dL (ref 0–99)
Total CHOL/HDL Ratio: 5.4 ratio
Triglycerides: 362 mg/dL — ABNORMAL HIGH (ref <150)
VLDL: 72 mg/dL — ABNORMAL HIGH (ref 0–40)

## 2024-05-24 LAB — VITAMIN D 25 HYDROXY (VIT D DEFICIENCY, FRACTURES): Vit D, 25-Hydroxy: 26.29 ng/mL — ABNORMAL LOW (ref 30–100)

## 2024-05-24 MED ORDER — TRAZODONE HCL 100 MG PO TABS
100.0000 mg | ORAL_TABLET | Freq: Every day | ORAL | Status: DC
  Administered 2024-05-24 – 2024-05-26 (×3): 100 mg via ORAL
  Filled 2024-05-24 (×3): qty 1
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
## 2024-05-24 NOTE — [facility] Group Notes (Signed)
 [facility] Group Notes:  (Nursing/MHT/Case Management/Adjunct)  Date:  05/24/2024  Time:  10:21 PM  Type of Therapy:  Psychoeducational Skills  Participation Level:  Did Not Attend  Participation Quality:  Resistant  Affect:  Resistant  Cognitive:  Lacking  Insight:  None  Engagement in Group:  None  Modes of Intervention:  Education  Summary of Progress/Problems: Patient did not attend group this evening.   Holly Farrell S 05/24/2024, 10:21 PM

## 2024-05-24 NOTE — Group Note (Signed)
 Therapy Group Note  Group Topic:Other  Group Date: 05/24/2024 Start Time: 1430 End Time: 1503 Facilitators: Lynnda Sas, OT    The primary objective of this topic is to explore and understand the concept of occupational balance in the context of daily living. The term "occupational balance" is defined broadly, encompassing all activities that occupy an individual's time and energy, including self-care, leisure, and work-related tasks. The goal is to guide participants towards achieving a harmonious blend of these activities, tailored to their personal values and life circumstances. This balance is aimed at enhancing overall well-being, not by equally distributing time across activities, but by ensuring that daily engagements are fulfilling and not draining. The content delves into identifying various barriers that individuals face in achieving occupational balance, such as overcommitment, misaligned priorities, external pressures, and lack of effective time management. The impact of these barriers on occupational performance, roles, and lifestyles is examined, highlighting issues like reduced efficiency, strained relationships, and potential health problems. Strategies for cultivating occupational balance are a key focus. These strategies include practical methods like time blocking, prioritizing tasks, establishing self-care rituals, decluttering, connecting with nature, and engaging in reflective practices. These approaches are designed to be adaptable and applicable to a wide range of life scenarios, promoting a proactive and mindful approach to daily living. The overall aim is to equip participants with the knowledge and tools to create a balanced lifestyle that supports their mental, emotional, and physical health, thereby improving their functional performance in daily life.     Participation Level: Active and Engaged   Participation Quality: Independent   Behavior: Appropriate    Speech/Thought Process: Slurred   Affect/Mood: Appropriate   Insight: Fair   Judgement: Fair      Modes of Intervention: Education  Patient Response to Interventions:  Attentive   Plan: Continue to engage patient in OT groups 2 - 3x/week.  05/24/2024  Lynnda Sas, OT Kamira Mellette, OT

## 2024-05-24 NOTE — Group Note (Signed)
 Recreation Therapy Group Note   Group Topic:Stress Management  Group Date: 05/24/2024 Start Time: 0935 End Time: 0955 Facilitators: Cherissa Hook-McCall, LRT,CTRS Location: [address] Day[address]   Group Topic: Stress Management  Goal Area(s) Addresses:  Patient will identify positive stress management techniques. Patient will identify benefits of using stress management post d/c.  Behavioral Response:   Intervention: Insight Timer App  Activity: Meditation. LRT played a meditation that focused encouraged patients to envision their purpose for the day. Patients were to sit, focus and visualize how they wanted their purpose to guide their day.    Education:  Stress Management, Discharge Planning.   Education Outcome: Acknowledges Education   Affect/Mood: N/A   Participation Level: Did not attend    Clinical Observations/Individualized Feedback:     Plan: Continue to engage patient in RT group sessions 2-3x/week.   Sou Nohr-McCall, LRT,CTRS  05/24/2024 12:03 PM

## 2024-05-24 NOTE — Group Note (Signed)
 Date:  05/24/2024 Time:  8:30 AM  Group Topic/Focus:  Goals Group:   The focus of this group is to help patients establish daily goals to achieve during treatment and discuss how the patient can incorporate goal setting into their daily lives to aide in recovery.    Participation Level:  Active  Participation Quality:  Appropriate  Affect:  Appropriate   Holly Farrell 05/24/2024, 8:30 AM

## 2024-05-24 NOTE — Progress Notes (Signed)
 CSW attempted to contact patient's mother [name] [phone]  for completion of SPE, however, no response. Left voicemail awaiting return call.   Ashaz Robling, LCSWA

## 2024-05-24 NOTE — Progress Notes (Signed)
   05/23/24 2217  Psych Admission Type (Psych Patients Only)  Admission Status Voluntary  Psychosocial Assessment  Patient Complaints None  Eye Contact Fair  Facial Expression Flat  Affect Anxious  Speech Logical/coherent  Interaction Assertive  Motor Activity Slow  Appearance/Hygiene Unremarkable  Behavior Characteristics Cooperative  Mood Depressed  Thought Process  Coherency WDL  Content WDL  Delusions None reported or observed  Perception WDL  Hallucination None reported or observed  Judgment Impaired  Confusion None  Danger to Self  Current suicidal ideation? Denies  Agreement Not to Harm Self Yes  Description of Agreement Verbal  Danger to Others  Danger to Others None reported or observed

## 2024-05-24 NOTE — Plan of Care (Signed)
  Problem: Education: Goal: Emotional status will improve Outcome: Progressing Goal: Mental status will improve Outcome: Progressing Goal: Verbalization of understanding the information provided will improve Outcome: Progressing   Problem: Activity: Goal: Interest or engagement in activities will improve Outcome: Progressing   Problem: Safety: Goal: Periods of time without injury will increase Outcome: Progressing

## 2024-05-24 NOTE — Plan of Care (Signed)
  Problem: Coping: Goal: Ability to verbalize frustrations and anger appropriately will improve Outcome: Progressing   Problem: Activity: Goal: Interest or engagement in activities will improve Outcome: Progressing   Problem: Safety: Goal: Periods of time without injury will increase Outcome: Progressing

## 2024-05-24 NOTE — Progress Notes (Signed)
   05/24/24 0800  Psych Admission Type (Psych Patients Only)  Admission Status Voluntary  Psychosocial Assessment  Patient Complaints None  Eye Contact Fair  Facial Expression Flat  Affect Flat  Speech Logical/coherent  Interaction Cautious  Motor Activity Slow  Appearance/Hygiene Unremarkable  Behavior Characteristics Cooperative;Appropriate to situation  Mood Depressed  Thought Process  Coherency WDL  Content WDL  Delusions None reported or observed  Perception WDL  Hallucination None reported or observed  Judgment Impaired  Confusion None  Danger to Self  Current suicidal ideation? Denies  Agreement Not to Harm Self Yes  Description of Agreement verbal  Danger to Others  Danger to Others None reported or observed

## 2024-05-24 NOTE — BH IP Treatment Plan (Signed)
 Interdisciplinary Treatment and Diagnostic Plan Update  05/24/2024 Time of Session: 10:10AM Holly Farrell MRN: [id number]  Principal Diagnosis: Bipolar 1 disorder, depressed, severe (HCC)  Secondary Diagnoses: Principal Problem:   Bipolar 1 disorder, depressed, severe (HCC)   Current Medications:  Current Facility-Administered Medications  Medication Dose Route Frequency Provider Last Rate Last Admin   acetaminophen  (TYLENOL ) tablet 650 mg  650 mg Oral Q6H PRN Robet Chiquito, NP       alum & mag hydroxide-simeth (MAALOX/MYLANTA) 200-200-20 MG/5ML suspension 30 mL  30 mL Oral Q4H PRN Robet Chiquito, NP       [START ON 05/25/2024] ARIPiprazole  (ABILIFY ) tablet 10 mg  10 mg Oral Daily Attiah, Nadir, MD       haloperidol (HALDOL) tablet 5 mg  5 mg Oral TID PRN Robet Chiquito, NP       And   diphenhydrAMINE  (BENADRYL ) capsule 50 mg  50 mg Oral TID PRN Robet Chiquito, NP       haloperidol lactate (HALDOL) injection 5 mg  5 mg Intramuscular TID PRN Robet Chiquito, NP       And   diphenhydrAMINE  (BENADRYL ) injection 50 mg  50 mg Intramuscular TID PRN Robet Chiquito, NP       And   LORazepam (ATIVAN) injection 2 mg  2 mg Intramuscular TID PRN Robet Chiquito, NP       haloperidol lactate (HALDOL) injection 10 mg  10 mg Intramuscular TID PRN Robet Chiquito, NP       And   diphenhydrAMINE  (BENADRYL ) injection 50 mg  50 mg Intramuscular TID PRN Robet Chiquito, NP       And   LORazepam (ATIVAN) injection 2 mg  2 mg Intramuscular TID PRN Robet Chiquito, NP       hydrOXYzine  (ATARAX ) tablet 50 mg  50 mg Oral Q6H PRN Linnie Riches, Nadir, MD       magnesium  hydroxide (MILK OF MAGNESIA) suspension 30 mL  30 mL Oral Daily PRN Nkwenti, Doris, NP       nicotine  (NICODERM CQ  - dosed in mg/24 hours) patch 14 mg  14 mg Transdermal Q0600 Robet Chiquito, NP   14 mg at 05/24/24 7829   nicotine  polacrilex (NICORETTE) gum 2 mg  2 mg Oral PRN Alver Jobs, MD   2 mg at 05/23/24 1817   OLANZapine (ZYPREXA) injection 10  mg  10 mg Intramuscular TID PRN Robet Chiquito, NP       OLANZapine (ZYPREXA) injection 5 mg  5 mg Intramuscular TID PRN Robet Chiquito, NP       traZODone  (DESYREL ) tablet 100 mg  100 mg Oral QHS Attiah, Nadir, MD       PTA Medications: Medications Prior to Admission  Medication Sig Dispense Refill Last Dose/Taking   hydrOXYzine  (ATARAX ) 25 MG tablet Take 1 tablet (25 mg total) by mouth daily as needed for anxiety. 90 tablet 0    lurasidone  (LATUDA ) 40 MG TABS tablet Take 1 tablet (40 mg total) by mouth daily with breakfast. 90 tablet 0    traZODone  (DESYREL ) 50 MG tablet Take 0.5-1 tablets (25-50 mg total) by mouth at bedtime as needed for sleep. 90 tablet 0     Patient Stressors:    Patient Strengths:    Treatment Modalities: Medication Management, Group therapy, Case management,  1 to 1 session with clinician, Psychoeducation, Recreational therapy.   Physician Treatment Plan for Primary Diagnosis: Bipolar 1 disorder, depressed, severe (HCC) Long Term Goal(s): Improvement in symptoms so as ready for discharge  Short Term Goals: Ability to identify changes in lifestyle to reduce recurrence of condition will improve Ability to verbalize feelings will improve Ability to disclose and discuss suicidal ideas Ability to demonstrate self-control will improve Ability to identify and develop effective coping behaviors will improve  Medication Management: Evaluate patient's response, side effects, and tolerance of medication regimen.  Therapeutic Interventions: 1 to 1 sessions, Unit Group sessions and Medication administration.  Evaluation of Outcomes: Not Progressing  Physician Treatment Plan for Secondary Diagnosis: Principal Problem:   Bipolar 1 disorder, depressed, severe (HCC)  Long Term Goal(s): Improvement in symptoms so as ready for discharge   Short Term Goals: Ability to identify changes in lifestyle to reduce recurrence of condition will improve Ability to verbalize  feelings will improve Ability to disclose and discuss suicidal ideas Ability to demonstrate self-control will improve Ability to identify and develop effective coping behaviors will improve     Medication Management: Evaluate patient's response, side effects, and tolerance of medication regimen.  Therapeutic Interventions: 1 to 1 sessions, Unit Group sessions and Medication administration.  Evaluation of Outcomes: Not Progressing   RN Treatment Plan for Primary Diagnosis: Bipolar 1 disorder, depressed, severe (HCC) Long Term Goal(s): Knowledge of disease and therapeutic regimen to maintain health will improve  Short Term Goals: Ability to remain free from injury will improve, Ability to verbalize frustration and anger appropriately will improve, Ability to demonstrate self-control, Ability to participate in decision making will improve, Ability to verbalize feelings will improve, Ability to disclose and discuss suicidal ideas, and Ability to identify and develop effective coping behaviors will improve  Medication Management: RN will administer medications as ordered by provider, will assess and evaluate patient's response and provide education to patient for prescribed medication. RN will report any adverse and/or side effects to prescribing provider.  Therapeutic Interventions: 1 on 1 counseling sessions, Psychoeducation, Medication administration, Evaluate responses to treatment, Monitor vital signs and CBGs as ordered, Perform/monitor CIWA, COWS, AIMS and Fall Risk screenings as ordered, Perform wound care treatments as ordered.  Evaluation of Outcomes: Not Progressing   LCSW Treatment Plan for Primary Diagnosis: Bipolar 1 disorder, depressed, severe (HCC) Long Term Goal(s): Safe transition to appropriate next level of care at discharge, Engage patient in therapeutic group addressing interpersonal concerns.  Short Term Goals: Engage patient in aftercare planning with referrals and  resources, Increase social support, Increase ability to appropriately verbalize feelings, Increase emotional regulation, Facilitate acceptance of mental health diagnosis and concerns, Facilitate patient progression through stages of change regarding substance use diagnoses and concerns, Identify triggers associated with mental health/substance abuse issues, and Increase skills for wellness and recovery  Therapeutic Interventions: Assess for all discharge needs, 1 to 1 time with Social worker, Explore available resources and support systems, Assess for adequacy in community support network, Educate family and significant other(s) on suicide prevention, Complete Psychosocial Assessment, Interpersonal group therapy.  Evaluation of Outcomes: Not Progressing   Progress in Treatment: Attending groups: Yes. Pt attending some groups Participating in groups: Yes. Taking medication as prescribed: Yes. Toleration medication: Yes. Family/Significant other contact made: No, will contact:  [name] (mother) [phone] Patient understands diagnosis: Yes. Discussing patient identified problems/goals with staff: Yes. Medical problems stabilized or resolved: Yes. Denies suicidal/homicidal ideation: Yes. Issues/concerns per patient self-inventory: No.  Patient Goals:  "I want to get my meds adjusted".  Discharge Plan or Barriers: Home  Reason for Continuation of Hospitalization: Depression Medication stabilization  Estimated Length of Stay: 5-7 days  Last 3 [place] Suicide Severity  Risk Score: Flowsheet Row [facility]Current[facility] Most recent reading at 05/22/2024  5:00 PM ED from 05/22/2024 in [facility] Most recent reading at 05/22/2024  2:48 AM Office Visit from [facility]/22/2024 in [facility] Most recent reading at 04/22/2024  3:27 PM  C-SSRS RISK CATEGORY Low Risk High Risk  Error: Question 6 not populated       Last [facility] 2/9 Scores:    04/22/2024    3:27 PM 12/22/2023   12:13 PM 10/23/2023   12:53 PM  Depression screen PHQ 2/9  Decreased Interest 2 1 1   Down, Depressed, Hopeless 1 1 1   PHQ - 2 Score 3 2 2   Altered sleeping 2 2 3   Tired, decreased energy 2 1 2   Change in appetite 2 1 2   Feeling bad or failure about yourself  2 0 1  Trouble concentrating 2 2 2   Moving slowly or fidgety/restless 2 1 3   Suicidal thoughts 1 0 1  PHQ-9 Score 16 9 16   Difficult doing work/chores  Somewhat difficult Somewhat difficult    Scribe for Treatment Team: Saray Capasso M Dae Highley, Milinda Allen 05/24/2024 2:05 PM

## 2024-05-25 ENCOUNTER — Telehealth: Payer: Self-pay | Admitting: Psychiatry

## 2024-05-25 DIAGNOSIS — F314 Bipolar disorder, current episode depressed, severe, without psychotic features: Secondary | ICD-10-CM | POA: Diagnosis not present

## 2024-05-25 MED ORDER — FLUOXETINE HCL 10 MG PO CAPS
10.0000 mg | ORAL_CAPSULE | Freq: Every day | ORAL | Status: DC
  Administered 2024-05-25 – 2024-05-27 (×3): 10 mg via ORAL
  Filled 2024-05-25 (×3): qty 1

## 2024-05-25 MED ORDER — TRAZODONE HCL 50 MG PO TABS
50.0000 mg | ORAL_TABLET | Freq: Every evening | ORAL | Status: DC | PRN
  Filled 2024-05-25: qty 1

## 2024-05-25 NOTE — Plan of Care (Signed)
   Problem: Education: Goal: Emotional status will improve Outcome: Progressing Goal: Mental status will improve Outcome: Progressing   Problem: Activity: Goal: Interest or engagement in activities will improve Outcome: Progressing Goal: Sleeping patterns will improve Outcome: Progressing

## 2024-05-25 NOTE — Group Note (Signed)
 LCSW Group Therapy Note   Group Date: 05/25/2024 Start Time: 1100 End Time: 1200   Participation: patient was present  Type of Therapy:  Group Therapy   Topic: Lifestyle:  from "One Day" to "Today is Day One"  Objective:  To promote mental and physical well-being through lifestyle changes in routine, nutrition, sleep, and movement.  Goals: Increase awareness of how lifestyle habits impact mental health. Encourage one small, achievable wellness goal. Support group sharing and accountability.  Summary:  Group members explored how daily habits influence mental health and discussed the importance of starting with small, manageable changes. Participants identified personal goals and shared reflections on improving structure, sleep, diet, and physical activity.  Therapeutic Modalities: CBT - Identifying and challenging all-or-nothing thinking; promoting realistic, helpful thoughts about change. Psychoeducation - Teaching about the impact of sleep, nutrition, movement, and routine on mental health. Motivational Interviewing - Eliciting personal motivation and exploring readiness for change. Goal-Setting - Supporting SMART goals to build self-efficacy and encourage follow-through.   Waller Marcussen O Kelton Bultman, LCSWA 05/25/2024  12:24 PM

## 2024-05-25 NOTE — B[facility] Group Notes (Signed)
 Spiritual care group on grief and loss facilitated by Chaplain Nick Barman, [facility]  Group Goal: Support / Education around grief and loss  Members engage in facilitated group support and psycho-social education.  Group Description:  Following introductions and group rules, group members engaged in facilitated group dialogue and support around topic of loss, with particular support around experiences of loss in their lives. Group Identified types of loss (relationships / self / things) and identified patterns, circumstances, and changes that precipitate losses. Reflected on thoughts / feelings around loss, normalized grief responses, and recognized variety in grief experience. Group encouraged individual reflection on safe space and on the coping skills that they are already utilizing.  Group drew on Adlerian / Rogerian and narrative framework  Patient Progress: Holly Farrell attended group and actively engaged and participated in group conversation and activities.

## 2024-05-25 NOTE — Plan of Care (Signed)
  Problem: Education: Goal: Emotional status will improve Outcome: Progressing Goal: Mental status will improve Outcome: Progressing Goal: Verbalization of understanding the information provided will improve Outcome: Progressing   Problem: Activity: Goal: Interest or engagement in activities will improve Outcome: Progressing   Problem: Safety: Goal: Periods of time without injury will increase Outcome: Progressing

## 2024-05-25 NOTE — Group Note (Signed)
 Date:  05/25/2024 Time:  8:53 AM  Group Topic/Focus:  Goals Group:   The focus of this group is to help patients establish daily goals to achieve during treatment and discuss how the patient can incorporate goal setting into their daily lives to aide in recovery. Orientation:   The focus of this group is to educate the patient on the purpose and policies of crisis stabilization and provide a format to answer questions about their admission.  The group details unit policies and expectations of patients while admitted.    Participation Level:  Did Not Attend

## 2024-05-25 NOTE — Progress Notes (Signed)
 CSW spoke with patient who has requested a change in follow up providers, as she no longer wishes to go to [facility]. Patient reported being unsatisfied with treatment at [facility] and is requesting a new provider in [place] or [place]. Informed patient I'd relay information to scheduling specialist for new appointments elsewhere.   Icel Castles, LCSWA

## 2024-05-25 NOTE — Group Note (Unsigned)
 Date:  05/25/2024 Time:  2:28 PM  Group Topic/Focus:  Goals Group:   The focus of this group is to help patients establish daily goals to achieve during treatment and discuss how the patient can incorporate goal setting into their daily lives to aide in recovery.     Participation Level:  {BHH PARTICIPATION ZOXWR:60454}  Participation Quality:  {BHH PARTICIPATION QUALITY:22265}  Affect:  {BHH AFFECT:22266}  Cognitive:  {BHH COGNITIVE:22267}  Insight: {BHH Insight2:20797}  Engagement in Group:  {BHH ENGAGEMENT IN UJWJX:91478}  Modes of Intervention:  {BHH MODES OF INTERVENTION:22269}  Additional Comments:  ***  Worley Headings Isreal Moline 05/25/2024, 2:28 PM

## 2024-05-25 NOTE — Progress Notes (Signed)
   05/24/24 2209  Psych Admission Type (Psych Patients Only)  Admission Status Voluntary  Psychosocial Assessment  Patient Complaints None  Eye Contact Fair  Facial Expression Flat  Affect Flat  Speech Logical/coherent  Interaction Assertive  Motor Activity Slow  Appearance/Hygiene Unremarkable  Behavior Characteristics Cooperative;Calm  Mood Depressed  Thought Process  Coherency WDL  Content WDL  Delusions None reported or observed  Perception WDL  Hallucination None reported or observed  Judgment Impaired  Confusion None  Danger to Self  Current suicidal ideation? Denies  Agreement Not to Harm Self Yes  Description of Agreement Verbal  Danger to Others  Danger to Others None reported or observed

## 2024-05-25 NOTE — Group Note (Signed)
 Date:  05/25/2024 Time:  10:47 PM  Group Topic/Focus:  Wrap-Up Group:   The focus of this group is to help patients review their daily goal of treatment and discuss progress on daily workbooks.    Participation Level:  Active  Participation Quality:  Appropriate  Affect:  Appropriate  Cognitive:  Appropriate  Insight: Appropriate  Engagement in Group:  Engaged  Modes of Intervention:  Discussion  Additional Comments:   Pt states that she had a good day and has been able to engage with programming and her peers. Pt states that her anxiety has been more today due to outside interferences but has been mostly fine most of the day.   Holly Farrell 05/25/2024, 10:47 PM
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
## 2024-05-25 NOTE — Telephone Encounter (Signed)
 PT is seeking new provider. Her follow up with you has been cancelled (per [facility] at [facility])

## 2024-05-25 NOTE — [facility] Suicide Risk Assessment (Signed)
 [facility] INPATIENT:  Family/Significant Other Suicide Prevention Education  Suicide Prevention Education:  Education Completed; [name] [phone], mother  (name of family member/significant other) has been identified by the patient as the family member/significant other with whom the patient will be residing, and identified as the person(s) who will aid the patient in the event of a mental health crisis (suicidal ideations/suicide attempt).  With written consent from the patient, the family member/significant other has been provided the following suicide prevention education, prior to the and/or following the discharge of the patient.  Patient's mother Ave Leisure confirmed patient will not have access to firearms/guns/weapons to harm herself. Ave Leisure also reported to be a person of support should patient have a mental health crisis. Ave Leisure reported no concerns pertaining to patient discharging home safely.   The suicide prevention education provided includes the following: Suicide risk factors Suicide prevention and interventions National Suicide Hotline telephone number [facility] assessment telephone number [facility] Emergency Assistance 911 [facility] and/or Residential Mobile Crisis Unit telephone number  Request made of family/significant other to: Remove weapons (e.g., guns, rifles, knives), all items previously/currently identified as safety concern.   Remove drugs/medications (over-the-counter, prescriptions, illicit drugs), all items previously/currently identified as a safety concern.  The family member/significant other verbalizes understanding of the suicide prevention education information provided.  The family member/significant other agrees to remove the items of safety concern listed above.  Carynn Felling M Saavi Mceachron, LCSWA 05/25/2024, 11:30 AM

## 2024-05-25 NOTE — Progress Notes (Addendum)
 Patient denies SI/HI/AVH this morning. Pt rates her depression a 4/10 and anxiety a 6/10. Pt reports that she slept "fair" last night. Pt has been interactive on the unit and participating in groups throughout the day. Pt has been calm and cooperative throughout the day. Patient has been compliant with medications and treatment plan. Q 15 minute safety checks are in place for patient's safety. Patient is currently safe on the unit.   05/25/24 1028  Psych Admission Type (Psych Patients Only)  Admission Status Voluntary  Psychosocial Assessment  Patient Complaints Anxiety;Depression  Eye Contact Fair  Facial Expression Animated  Affect Appropriate to circumstance  Speech Logical/coherent  Interaction Assertive  Motor Activity Other (Comment) (WDL)  Appearance/Hygiene Unremarkable  Behavior Characteristics Cooperative;Appropriate to situation  Mood Depressed;Anxious  Thought Process  Coherency WDL  Content WDL  Delusions None reported or observed  Perception WDL  Hallucination None reported or observed  Judgment Impaired  Confusion None  Danger to Self  Current suicidal ideation? Denies  Description of Suicide Plan No plan  Self-Injurious Behavior No self-injurious ideation or behavior indicators observed or expressed   Agreement Not to Harm Self Yes  Description of Agreement verbal  Danger to Others  Danger to Others None reported or observed

## 2024-05-26 ENCOUNTER — Ambulatory Visit: Admitting: Professional Counselor

## 2024-05-26 DIAGNOSIS — F314 Bipolar disorder, current episode depressed, severe, without psychotic features: Secondary | ICD-10-CM | POA: Diagnosis not present

## 2024-05-26 MED ORDER — ASPIRIN-ACETAMINOPHEN-CAFFEINE 250-250-65 MG PO TABS
1.0000 | ORAL_TABLET | Freq: Three times a day (TID) | ORAL | Status: DC | PRN
  Administered 2024-05-26 (×2): 1 via ORAL
  Filled 2024-05-26 (×3): qty 1

## 2024-05-26 NOTE — Plan of Care (Signed)
   Problem: Education: Goal: Emotional status will improve Outcome: Progressing Goal: Mental status will improve Outcome: Progressing   Problem: Activity: Goal: Interest or engagement in activities will improve Outcome: Progressing Goal: Sleeping patterns will improve Outcome: Progressing

## 2024-05-26 NOTE — [facility] Group Notes (Signed)
 Adult Psychoeducational Group Note  Date:  05/26/2024 Time:  12:57 PM  Group Topic/Focus:  Goals Group:   The focus of this group is to help patients establish daily goals to achieve during treatment and discuss how the patient can incorporate goal setting into their daily lives to aide in recovery.  Participation Level:  Did Not Attend  Participation Quality:  na  Affect:  na  Cognitive:  na  Insight: na  Engagement in Group:  na  Modes of Intervention:  na  Additional Comments:  Pt did not attend group  Alean Amen 05/26/2024, 12:57 PM

## 2024-05-26 NOTE — Progress Notes (Signed)
   05/25/24 2321  Psych Admission Type (Psych Patients Only)  Admission Status Voluntary  Psychosocial Assessment  Patient Complaints Anxiety;Depression  Eye Contact Fair  Facial Expression Animated  Affect Appropriate to circumstance  Speech Logical/coherent  Interaction Assertive  Motor Activity Other (Comment) (WDL)  Appearance/Hygiene Unremarkable  Behavior Characteristics Cooperative;Appropriate to situation  Mood Depressed;Anxious  Thought Process  Coherency WDL  Content WDL  Delusions None reported or observed  Perception WDL  Hallucination None reported or observed  Judgment Impaired  Confusion None  Danger to Self  Current suicidal ideation? Denies  Self-Injurious Behavior No self-injurious ideation or behavior indicators observed or expressed   Agreement Not to Harm Self Yes  Description of Agreement verbal  Danger to Others  Danger to Others None reported or observed

## 2024-05-26 NOTE — Progress Notes (Addendum)
 Patient denies SI/HI/AVH this morning.Pt reports that she slept "poor" last night. Pt has been interactive on the unit and participating in groups throughout the day. Pt has been calm and cooperative throughout the day. Patient has been compliant with medications and treatment plan. Q 15 minute safety checks are in place for patient's safety. Patient is currently safe on the unit.   05/26/24 0900  Psych Admission Type (Psych Patients Only)  Admission Status Voluntary  Psychosocial Assessment  Patient Complaints Anxiety;Depression  Eye Contact Fair  Facial Expression Animated  Affect Appropriate to circumstance  Speech Logical/coherent  Interaction Assertive  Motor Activity Other (Comment) (WDL)  Appearance/Hygiene Unremarkable  Behavior Characteristics Appropriate to situation;Cooperative  Mood Depressed;Anxious  Thought Process  Coherency WDL  Content WDL  Delusions None reported or observed  Perception WDL  Hallucination None reported or observed  Judgment Impaired  Confusion None  Danger to Self  Current suicidal ideation? Denies  Description of Suicide Plan No plan  Self-Injurious Behavior No self-injurious ideation or behavior indicators observed or expressed   Agreement Not to Harm Self Yes  Description of Agreement verbal  Danger to Others  Danger to Others None reported or observed
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
## 2024-05-26 NOTE — Group Note (Signed)
 Date:  05/26/2024 Time:  8:18 PM  Group Topic/Focus:  AA/ NA    Participation Level:  Active  Participation Quality:  Appropriate and Attentive  Affect:  Appropriate  Cognitive:  Appropriate  Insight: Appropriate and Good  Engagement in Group:  Engaged  Modes of Intervention:  Discussion  Additional Comments:  Patient participated in group this evening   Moishe Angel 05/26/2024, 8:18 PM

## 2024-05-26 NOTE — Group Note (Signed)
 Recreation Therapy Group Note   Group Topic:Health and Wellness  Group Date: 05/26/2024 Start Time: 0940 End Time: 1000 Facilitators: Jakeem Grape-McCall, LRT,CTRS Location: [address] Dayroom   Group Topic: Wellness  Goal Area(s) Addresses:  Patient will define components of whole wellness. Patient will verbalize benefit of whole wellness.  Behavioral Response:   Intervention: Music  Activity: Exercise. LRT and patients completed a series of stretches and exercises. Patients took turns leading the group in the exercises and stretches of their choosing. Patients were encouraged to get water and take breaks as needed.   Education: Wellness, Building control surveyor.   Education Outcome: Acknowledges education/In group clarification offered/Needs additional education.    Affect/Mood: N/A   Participation Level: Did not attend    Clinical Observations/Individualized Feedback:     Plan: Continue to engage patient in RT group sessions 2-3x/week.   Ahad Colarusso-McCall, LRT,CTRS 05/26/2024 12:59 PM

## 2024-05-27 DIAGNOSIS — F314 Bipolar disorder, current episode depressed, severe, without psychotic features: Secondary | ICD-10-CM | POA: Diagnosis not present

## 2024-05-27 MED ORDER — NICOTINE 14 MG/24HR TD PT24: 14.0000 mg | MEDICATED_PATCH | Freq: Every day | TRANSDERMAL | Status: AC

## 2024-05-27 MED ORDER — HYDROXYZINE HCL 50 MG PO TABS: 50.0000 mg | ORAL_TABLET | Freq: Four times a day (QID) | ORAL | 0 refills | Status: AC | PRN

## 2024-05-27 MED ORDER — NICOTINE POLACRILEX 2 MG MT GUM: 2.0000 mg | CHEWING_GUM | OROMUCOSAL | Status: AC | PRN

## 2024-05-27 MED ORDER — TRAZODONE HCL 100 MG PO TABS: 100.0000 mg | ORAL_TABLET | Freq: Every day | ORAL | 0 refills | Status: DC

## 2024-05-27 MED ORDER — FLUOXETINE HCL 10 MG PO CAPS: 10.0000 mg | ORAL_CAPSULE | Freq: Every day | ORAL | 0 refills | Status: DC

## 2024-05-27 MED ORDER — ARIPIPRAZOLE 10 MG PO TABS: 10.0000 mg | ORAL_TABLET | Freq: Every day | ORAL | 0 refills | Status: DC

## 2024-05-27 NOTE — Progress Notes (Signed)
 Patient discharged to home accompanied by family member. Discharge instructions, all required discharge documents and information about follow-up appointment given to pt with verbalization of understanding. All personal belongings returned to pt at time of discharge. Pt escorted to lobby by MHT at 1122.  05/27/24 0900  Psych Admission Type (Psych Patients Only)  Admission Status Voluntary  Psychosocial Assessment  Patient Complaints Anxiety  Eye Contact Fair  Facial Expression Animated  Affect Appropriate to circumstance  Speech Logical/coherent  Interaction Assertive  Motor Activity Other (Comment) (WNL)  Appearance/Hygiene Unremarkable  Behavior Characteristics Appropriate to situation  Mood Anxious  Thought Process  Coherency WDL  Content WDL  Delusions None reported or observed  Perception WDL  Hallucination None reported or observed  Judgment Impaired  Confusion None  Danger to Self  Current suicidal ideation? Denies  Agreement Not to Harm Self Yes  Description of Agreement Verbal  Danger to Others  Danger to Others None reported or observed

## 2024-05-27 NOTE — Progress Notes (Signed)
   05/26/24 2203  Psych Admission Type (Psych Patients Only)  Admission Status Voluntary  Psychosocial Assessment  Patient Complaints Anxiety;Depression  Eye Contact Fair  Facial Expression Animated  Affect Appropriate to circumstance  Speech Logical/coherent  Interaction Assertive  Motor Activity Other (Comment) (WDL)  Appearance/Hygiene Unremarkable  Behavior Characteristics Appropriate to situation  Mood Depressed;Anxious  Thought Process  Coherency WDL  Content WDL  Delusions None reported or observed  Perception WDL  Hallucination None reported or observed  Judgment Impaired  Confusion None  Danger to Self  Current suicidal ideation? Denies  Agreement Not to Harm Self Yes  Description of Agreement verbal  Danger to Others  Danger to Others None reported or observed

## 2024-05-27 NOTE — B[facility] Suicide Risk Assessment (Signed)
 Suicide Risk Assessment  Discharge Assessment    [facility] Discharge Suicide Risk Assessment   Principal Problem: Bipolar 1 disorder, depressed, severe (HCC)  Discharge Diagnoses: Principal Problem:   Bipolar 1 disorder, depressed, severe (HCC)  Total Time spent with patient: Greater than 30 minutes  Musculoskeletal: Strength & Muscle Tone: within normal limits Gait & Station: normal Patient leans: N/A  Psychiatric Specialty Exam  Presentation  General Appearance:  Appropriate for Environment; Casual; Fairly Groomed  Eye Contact: Good  Speech: Clear and Coherent; Normal Rate  Speech Volume: Normal  Handedness: Right  Mood and Affect  Mood: -- ("My mood is improving".)  Duration of Depression Symptoms: Greater than two weeks  Affect: Congruent  Thought Process  Thought Processes: Coherent; Goal Directed; Linear  Descriptions of Associations:Intact  Orientation:Full (Time, Place and Person)  Thought Content:Logical  History of Schizophrenia/Schizoaffective disorder:No  Duration of Psychotic Symptoms:No data recorded Hallucinations:Hallucinations: None Description of Auditory Hallucinations: NA Description of Visual Hallucinations: NA  Ideas of Reference:None  Suicidal Thoughts:Suicidal Thoughts: No  Homicidal Thoughts:Homicidal Thoughts: No  Sensorium  Memory: Immediate Good; Recent Good; Remote Good  Judgment: Fair  Insight: Fair  Art therapist  Concentration: Good  Attention Span: Good  Recall: Good  Fund of Knowledge: Fair  Language: Good  Psychomotor Activity  Psychomotor Activity: Psychomotor Activity: Normal  Assets  Assets: Communication Skills; Desire for Improvement; Financial Resources/Insurance; Housing; Physical Health; Social Support; Resilience   Sleep  Sleep: Sleep: Good Number of Hours of Sleep: 7.75   Physical Exam/Ros: See discharge summary.  Blood pressure 122/79, pulse 80, temperature 98.3  F (36.8 C), temperature source Oral, resp. rate 16, height 5\' 3"  (1.6 m), weight 79.8 kg, SpO2 99%. Body mass index is 31.18 kg/m.  Mental Status Per Nursing Assessment::   On Admission:  Suicidal ideation indicated by patient  Demographic Factors:  Adolescent or young adult and Caucasian  Loss Factors: NA  Historical Factors: NA  Risk Reduction Factors:   Responsible for children under 11 years of age, Sense of responsibility to family, Employed, Living with another person, especially a relative, Positive social support, Positive therapeutic relationship, and Positive coping skills or problem solving skills  Continued Clinical Symptoms:  Severe Anxiety and/or Agitation Bipolar Disorder:   Depressive phase More than one psychiatric diagnosis Previous Psychiatric Diagnoses and Treatments  Cognitive Features That Contribute To Risk:  Polarized thinking and Thought constriction (tunnel vision)    Suicide Risk:  Minimal: No identifiable suicidal ideation.  Patients presenting with no risk factors but with morbid ruminations; may be classified as minimal risk based on the severity of the depressive symptoms   Follow-up Information     [facility]. Go on 06/02/2024.   Why: You have a hospital follow up appointment on 06/02/24 at 11:00 am .  The appointment will be held in person.  Following this appointment, you will be scheduled for a clinical assessment to obtain necessary therapy and medication management services. Contact information: [address] [place] [address] [phone]                Plan Of Care/Follow-up recommendations:  See the discharge recommendation above.  Asuncion Layer, NP, pmhnp, fnp-bc. 05/27/2024, 9:33 AM

## 2024-05-27 NOTE — Plan of Care (Signed)
   Problem: Activity: Goal: Interest or engagement in activities will improve Outcome: Progressing Goal: Sleeping patterns will improve Outcome: Progressing   Problem: Coping: Goal: Ability to verbalize frustrations and anger appropriately will improve Outcome: Progressing Goal: Ability to demonstrate self-control will improve Outcome: Progressing   Problem: Health Behavior/Discharge Planning: Goal: Identification of resources available to assist in meeting health care needs will improve Outcome: Progressing Goal: Compliance with treatment plan for underlying cause of condition will improve Outcome: Progressing   Problem: Safety: Goal: Periods of time without injury will increase Outcome: Progressing

## 2024-05-27 NOTE — Discharge Summary (Signed)
 Physician Discharge Summary Note  Patient:  Holly Farrell is an 28 y.o., female  MRN:  [id number]  DOB:  1996-09-06  Patient phone:  [phone] (home)   Patient address:   [address] [place] [address],   Total Time spent with patient: Greater than 30 minutes.  Date of Admission:  05/22/2024  Date of Discharge: 05-27-24  Reason for Admission: Worsening symptoms of depression & anxiety triggering passive suicidal ideations feeling "I do not want to be here anymore". Was thinking to overdose or crash her car.   Principal Problem: Bipolar 1 disorder, depressed, severe (HCC)  Discharge Diagnoses: Principal Problem:   Bipolar 1 disorder, depressed, severe (HCC)  Past Psychiatric History: Bipolar disorder, depressed  Past Medical History:  Past Medical History:  Diagnosis Date   Acne 01/28/2017   Acute drug intoxication with perceptual disturbance (HCC) 09/13/2014   Anxiety    Bipolar disorder (HCC)    Bipolar II disorder (HCC) 06/25/2016   Depression    Family history of ovarian cancer    GERD (gastroesophageal reflux disease) 01/28/2017   Hemorrhoid 01/16/2018   History of head injury 09/23/2014   Overview:  Patient had TV fall on her head at age 85   Mania (monopolar) single episode or unspecified 09/23/2014   Overview:  Unclear if substance induced or true Bipolar Type I   Sedative, hypnotic or anxiolytic use disorder, mild, 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   Suicide attempt by drug ingestion (HCC) 06/25/2016    Past Surgical History:  Procedure Laterality Date   3rd degree laceration  03/2017   TONSILLECTOMY     WISDOM TOOTH EXTRACTION  2022   Family History:  Family History  Problem Relation Age of Onset   Depression Mother    Anxiety disorder Mother    Bipolar disorder Mother    Hypertension Mother    Asthma Father    Alcohol abuse Father    Sleep disorder Brother    Hypertension Maternal Grandmother    Bipolar disorder Maternal Grandfather    Ovarian  cancer Paternal Grandmother    Family Psychiatric  History: See H&P  Social History:  Social History   Substance and Sexual Activity  Alcohol Use Yes   Alcohol/week: 5.0 - 7.0 standard drinks of alcohol   Types: 2 - 3 Glasses of wine, 3 - 4 Cans of beer per week   Comment: 2-3 times per month     Social History   Substance and Sexual Activity  Drug Use Yes   Frequency: 1.0 times per week   Types: Marijuana   Comment: only using marijuana- other drugs no longer using    Social History   Socioeconomic History   Marital status: Single    Spouse name: Not on file   Number of children: 1   Years of education: Not on file   Highest education level: Some college, no degree  Occupational History   Not on file  Tobacco Use   Smoking status: Former    Current packs/day: 0.10    Average packs/day: 0.1 packs/day for 9.6 years (1.0 ttl pk-yrs)    Types: E-cigarettes, Cigarettes    Start date: 10/03/2014   Smokeless tobacco: Never  Vaping Use   Vaping status: Former  Substance and Sexual Activity   Alcohol use: Yes    Alcohol/week: 5.0 - 7.0 standard drinks of alcohol    Types: 2 - 3 Glasses of wine, 3 - 4 Cans of beer per week  Comment: 2-3 times per month   Drug use: Yes    Frequency: 1.0 times per week    Types: Marijuana    Comment: only using marijuana- other drugs no longer using   Sexual activity: Yes    Partners: Male    Birth control/protection: Condom  Other Topics Concern   Not on file  Social History Narrative   Not on file   Social Drivers of Health   Financial Resource Strain: Low Risk  (08/04/2023)   Received from [facility]   Overall Financial Resource Strain (CARDIA)    Difficulty of Paying Living Expenses: Not very hard  Food Insecurity: No Food Insecurity (05/22/2024)   Hunger Vital Sign    Worried About Running Out of Food in the Last Year: Never true    Ran Out of Food in the Last Year: Never true  Transportation Needs: No  Transportation Needs (05/22/2024)   PRAPARE - Administrator, Civil Service (Medical): No    Lack of Transportation (Non-Medical): No  Physical Activity: Inactive (05/08/2018)   Exercise Vital Sign    Days of Exercise per Week: 0 days    Minutes of Exercise per Session: 0 min  Stress: Not on file  Social Connections: Not on file   Hospital Course: (Per admission evaluation notes): 28 year old Caucasian female with a past psychiatric history significant for bipolar disorder type II, anxiety disorder who presents to the [facility] from behavioral health urgent care for evaluation and management of worsening depression, anxiety and SI. According to outside records, the patient patient presented to behavioral health urgent care voluntarily with her mother reporting worsening depression and anxiety, passive SI "I do not want to be here anymore thinking to overdose or crash her car.   Upon the decision by her treatment team to discharge Holly Farrell today, she was seen & evaluated by her treatment team for mood stability. The current laboratory findings were reviewed, stable. The nurses notes & vital signs were reviewed as well. All are stable. At this present time, there are no current mental health or medical issues that should prevent this discharge at this time. Patient is being discharged to continue mental health care & medication management as noted below. She is also aware & agreeable to this discharge.  Although with what seems like an extensive hx of mental health issues & probable previous psychiatric hospitalizations/treatments, this is Holly Farrell's first psychiatric admission/discharge summary from this [facility]. She was admitted with complaint of worsening symptoms of depression & anxiety triggering passive suicidal ideations feeling "I do not want to be here anymore". Was thinking to overdose or crash her car. She was recommended for mood stabilization treatments by her treatment  team after her admission evaluation. And with her consent, she was treated, stabilized & discharged on the medications as listed below on her discharge medication lists. She was also enrolled & participated in the group counseling sessions being offered & held on this unit. She learned coping skill. She presented no other significant pre-existing medical conditions that required treatment or monitoring. She tolerated her treatment regimen without any adverse effects or reactions reported. Holly Farrell's symptoms responded well to her treatment regimen without any reported side effects or adverse reactions. She is currently mentally & medically stable to be discharged to continue further mental health care/medication management as noted below. She & her mother are agreeable that this discharge is warranted.   During the course of her hospitalization, the 15-minute checks  were adequate to ensure Briann's safety. Patient did not display any dangerous, violent or suicidal behavior on the unit.  She interacted with the other patients & staff appropriately. She participated appropriately in the group sessions/therapies. Her medications were addressed & adjusted to meet her needs. She was recommended for outpatient follow-up care & medication management upon discharge to assure her continuity of care.  At the time of discharge patient is not reporting any acute suicidal/homicidal ideations. She feels more confident about her self & mental health care. She currently denies any new issues or concerns. Education and supportive counseling provided throughout her hospital stay & upon discharge.   Today upon her discharge evaluation with her treatment team, Renay Carota shares she is doing well. She denies any other specific concerns. She is sleeping well. Her appetite is good. She denies other physical complaints. She denies AH/VH, delusional thoughts or paranoia. She does not appear to be responding to any internal stimuli. She  feels that her medications have been helpful & is in agreement to continue her current treatment regimen as recommended. She was able to engage in safety planning including plan to return to [facility] or contact emergency services if she feels unable to maintain her own safety or the safety of others. Pt had no further questions, comments, or concerns. She left [facility] with all personal belongings in no apparent distress. Transportation per her family (mother)..  Physical Findings: AIMS: Facial and Oral Movements Muscles of Facial Expression: None Lips and Perioral Area: None Jaw: None Tongue: None,Extremity Movements Upper (arms, wrists, hands, fingers): None Lower (legs, knees, ankles, toes): None, Trunk Movements Neck, shoulders, hips: None, Global Judgements Severity of abnormal movements overall : None Incapacitation due to abnormal movements: None Patient's awareness of abnormal movements: No Awareness, Dental Status Current problems with teeth and/or dentures?: No Does patient usually wear dentures?: No Edentia?: No  CIWA:    COWS:     Musculoskeletal: Strength & Muscle Tone: within normal limits Gait & Station: normal Patient leans: N/A  Psychiatric Specialty Exam:  Presentation  General Appearance:  Appropriate for Environment; Casual; Fairly Groomed  Eye Contact: Good  Speech: Clear and Coherent; Normal Rate  Speech Volume: Normal  Handedness: Right  Mood and Affect  Mood: Euthymic  Affect: Congruent; Appropriate  Thought Process  Thought Processes: Coherent; Goal Directed; Linear  Descriptions of Associations:Intact  Orientation:Full (Time, Place and Person)  Thought Content:Logical  History of Schizophrenia/Schizoaffective disorder:No  Duration of Psychotic Symptoms: NA  Hallucinations:Hallucinations: None Description of Auditory Hallucinations: NA Description of Visual Hallucinations: NA  Ideas of Reference:None  Suicidal Thoughts:Suicidal  Thoughts: No  Homicidal Thoughts:Homicidal Thoughts: No  Sensorium  Memory: Immediate Good; Recent Good; Remote Good  Judgment: Good  Insight: Good  Executive Functions  Concentration: Good  Attention Span: Good  Recall: Good  Fund of Knowledge: Fair  Language: Good  Psychomotor Activity  Psychomotor Activity: Psychomotor Activity: Normal  Assets  Assets: Communication Skills; Desire for Improvement; Financial Resources/Insurance; Housing; Physical Health; Social Support; Resilience  Sleep  Sleep: Sleep: Good Number of Hours of Sleep: 8  Physical Exam: Physical Exam Vitals and nursing note reviewed.  HENT:     Head: Normocephalic.     Nose: Nose normal.     Mouth/Throat:     Pharynx: Oropharynx is clear.  Cardiovascular:     Rate and Rhythm: Normal rate.     Pulses: Normal pulses.  Pulmonary:     Effort: Pulmonary effort is normal.  Genitourinary:    Comments:  Deferred Musculoskeletal:        General: Normal range of motion.     Cervical back: Normal range of motion.  Skin:    General: Skin is dry.  Neurological:     General: No focal deficit present.     Mental Status: She is alert and oriented to person, place, and time. Mental status is at baseline.    Review of Systems  Constitutional:  Negative for chills, diaphoresis and fever.  HENT:  Negative for congestion and sore throat.   Respiratory:  Negative for cough, shortness of breath and wheezing.   Cardiovascular:  Negative for chest pain and palpitations.  Gastrointestinal:  Negative for abdominal pain, constipation, diarrhea, heartburn, nausea and vomiting.  Genitourinary:  Negative for dysuria.  Musculoskeletal:  Negative for joint pain and myalgias.  Skin:  Negative for itching and rash.  Neurological:  Negative for dizziness, tingling, tremors, sensory change, speech change, focal weakness, seizures, loss of consciousness, weakness and headaches.  Endo/Heme/Allergies:         Allergies: Hydrocodone, Tramadol, Lamictal.   Other: Latex.  Psychiatric/Behavioral:  Positive for depression (H xof (stable on medication).). Negative for hallucinations, memory loss, substance abuse and suicidal ideas. The patient has insomnia (Hx of (stable on medication).). The patient is not nervous/anxious (Hx of (Stable upon discharge).).    Blood pressure 122/79, pulse 80, temperature 98.3 F (36.8 C), temperature source Oral, resp. rate 16, height 5\' 3"  (1.6 m), weight 79.8 kg, SpO2 99%. Body mass index is 31.18 kg/m.  Social History   Tobacco Use  Smoking Status Former   Current packs/day: 0.10   Average packs/day: 0.1 packs/day for 9.6 years (1.0 ttl pk-yrs)   Types: E-cigarettes, Cigarettes   Start date: 10/03/2014  Smokeless Tobacco Never   Tobacco Cessation:  An FDA-approved tobacco cessation medication recommended at discharge  Blood Alcohol level:  Lab Results  Component Value Date   ETH <5 06/24/2016   Metabolic Disorder Labs:  Lab Results  Component Value Date   HGBA1C 5.0 05/22/2024   MPG 96.8 05/22/2024   Lab Results  Component Value Date   PROLACTIN 43.9 (H) 06/26/2016   Lab Results  Component Value Date   CHOL 184 05/24/2024   TRIG 362 (H) 05/24/2024   HDL 34 (L) 05/24/2024   CHOLHDL 5.4 05/24/2024   VLDL 72 (H) 05/24/2024   LDLCALC 78 05/24/2024   LDLCALC 135 (H) 06/26/2016   See Psychiatric Specialty Exam and Suicide Risk Assessment completed by Attending Physician prior to discharge.  Discharge destination:  Home  Is patient on multiple antipsychotic therapies at discharge:  No   Has Patient had three or more failed trials of antipsychotic monotherapy by history:  No  Recommended Plan for Multiple Antipsychotic Therapies: NA  Allergies as of 05/27/2024       Reactions   Hydrocodone Hives   Latex Swelling   Swelling of lips and rash   Lamictal [lamotrigine] Rash   Tramadol Rash        Medication List     STOP taking these  medications    lurasidone  40 MG Tabs tablet Commonly known as: Latuda        TAKE these medications      Indication  ARIPiprazole  10 MG tablet Commonly known as: ABILIFY  Take 1 tablet (10 mg total) by mouth daily. For mood control Start taking on: May 28, 2024  Indication: Mood control   FLUoxetine 10 MG capsule Commonly known as: PROZAC Take 1 capsule (10 mg  total) by mouth daily. For depression. Start taking on: May 28, 2024  Indication: Major Depressive Disorder   hydrOXYzine  50 MG tablet Commonly known as: ATARAX  Take 1 tablet (50 mg total) by mouth every 6 (six) hours as needed for anxiety. What changed:  medication strength how much to take when to take this  Indication: Feeling Anxious   nicotine  14 mg/24hr patch Commonly known as: NICODERM CQ  - dosed in mg/24 hours Place 1 patch (14 mg total) onto the skin daily at 6 (six) AM. (May buy from over the counter): For smoking cessation.  Indication: Nicotine  Addiction   nicotine  polacrilex 2 MG gum Commonly known as: NICORETTE Take 1 each (2 mg total) by mouth as needed. (May buy from over the counter): For smoking cessation.  Indication: Nicotine  Addiction   traZODone  100 MG tablet Commonly known as: DESYREL  Take 1 tablet (100 mg total) by mouth at bedtime. For sleep. What changed:  medication strength how much to take when to take this reasons to take this additional instructions  Indication: Trouble Sleeping        Follow-up Information     [facility] . Go on 06/02/2024.   Why: You have a hospital follow up appointment on 06/02/24 at 11:00 am .  The appointment will be held in person.  Following this appointment, you will be scheduled for a clinical assessment to obtain necessary therapy and medication management services. Contact information: [address] [place] [address] [phone]                Follow-up recommendations: Activity:  As tolerated Diet: As  recommended by your primary care doctor. Keep all scheduled follow-up appointments as recommended.    Comments:  Plan Of Care/Follow-up recommendations:  Activity: as tolerated  Diet: heart healthy  Other: -Follow-up with your outpatient psychiatric provider -instructions on appointment date, time, and address (location) are provided to you in discharge paperwork.  -Take your psychiatric medications as prescribed at discharge - instructions are provided to you in the discharge paperwork  -Follow-up with outpatient primary care doctor and other specialists -for management of preventative medicine and chronic medical issues  -Testing: Follow-up with outpatient provider for abnormal lab results: NA  -If you are prescribed an atypical antipsychotic medication, we recommend that your outpatient psychiatrist follow routine screening for side effects within 3 months of discharge, including monitoring: AIMS scale, height, weight, blood pressure, fasting lipid panel, HbA1c, and fasting blood sugar.   -Recommend total abstinence from alcohol, tobacco, and other illicit drug use at discharge.   -If your psychiatric symptoms recur, worsen, or if you have side effects to your psychiatric medications, call your outpatient psychiatric provider, 911, [phone] or go to the nearest emergency department.  -If suicidal thoughts occur, immediately call your outpatient psychiatric provider, 911, [phone] or go to the nearest emergency department.  Signed: Asuncion Layer, NP, pmhnp, fnp-bc. 05/27/2024, 9:55 AM

## 2024-05-31 ENCOUNTER — Telehealth: Payer: Self-pay

## 2024-05-31 DIAGNOSIS — F314 Bipolar disorder, current episode depressed, severe, without psychotic features: Secondary | ICD-10-CM

## 2024-06-01 ENCOUNTER — Telehealth: Payer: Self-pay

## 2024-06-01 NOTE — Progress Notes (Signed)
 Complex Care Management Note Care Guide Note  06/01/2024 Name: Holly Farrell MRN: [id number] DOB: 1996/06/08   Complex Care Management Outreach Attempts: An unsuccessful telephone outreach was attempted today to offer the patient information about available complex care management services.  Follow Up Plan:  No further outreach attempts will be made at this time. We have been unable to contact the patient to offer or enroll patient in complex care management services.  Encounter Outcome:  Patient Request to Call Back  [facility]  [facility], [facility] Health Care Management Assistant Direct Dial: [phone]  Fax: [phone]

## 2024-06-03 ENCOUNTER — Ambulatory Visit: Admitting: Psychiatry

## 2024-06-03 ENCOUNTER — Telehealth: Payer: Self-pay | Admitting: Psychiatry

## 2024-06-03 ENCOUNTER — Encounter: Payer: Self-pay | Admitting: Psychiatry

## 2024-06-03 ENCOUNTER — Other Ambulatory Visit: Payer: Self-pay | Admitting: Psychiatry

## 2024-06-03 VITALS — BP 122/84 | HR 100 | Temp 97.8°F | Ht 63.0 in | Wt 179.4 lb

## 2024-06-03 DIAGNOSIS — F319 Bipolar disorder, unspecified: Secondary | ICD-10-CM | POA: Diagnosis not present

## 2024-06-03 DIAGNOSIS — G4700 Insomnia, unspecified: Secondary | ICD-10-CM

## 2024-06-03 MED ORDER — FLUOXETINE HCL 10 MG PO CAPS: 10.0000 mg | ORAL_CAPSULE | Freq: Every day | ORAL | 0 refills | Status: AC

## 2024-06-03 MED ORDER — LURASIDONE HCL 20 MG PO TABS: 20.0000 mg | ORAL_TABLET | Freq: Every day | ORAL | 0 refills | Status: DC

## 2024-06-03 MED ORDER — LURASIDONE HCL 40 MG PO TABS: 40.0000 mg | ORAL_TABLET | Freq: Every day | ORAL | 0 refills | Status: DC

## 2024-06-03 NOTE — Progress Notes (Addendum)
 BH MD/PA/NP OP Progress Note  06/03/2024 3:03 PM Holly Farrell  MRN:  [id number]  Chief Complaint:  Chief Complaint  Patient presents with   Follow-up   HPI:  - According to the chart review, she was admitted to [facility] for bipolar 1 disorder.  She presented to [facility] voluntarily with her daughter for worsening depression, anxiety and passive SI, :thinking to overdose or crash her car.   According to the progress note, she experienced worsening in auditory hallucinations of calling her name, and VH of seeing shadows when she is depressed along with occasional paranoia. :She does report symptoms consistent with mania started at age 28 years old with most recent manic episode about 28 to 4 weeks ago that lasted at least 1 week with decreased need for sleep increased energy, impulsivity, shopping sprees, sexual impulsivity described, reports these manic episodes usually crash into depression afterward.  Discharged meds- Abilify  10 mg daily, fluoxetine  10 mg daily, hydroxyzine  50 mg qrhprn, trazodone  100 at night  This is a follow-up appointment for bipolar 1 disorder, insomnia/aftercare visit from the recent hospitalization.  She states that she experienced mania which lasted for about a week prior to going to the hospital.  She started dating, which she reflects it was impossible relationship related to mania.  She had argument about this with her mother. She blew up, and was super mad, pissed at each other.  She felt her mind runs 100 mph (she feels like this when she feels depressed).  She felt fighting with everybody.  She wanted to do things such as cleaning.  This followed by feeling depressed, crash and crying all the time. She had AH of calling her name, and VH of somebody passing. She felt somebody was following her, and she felt strange.  Her mother was scared due to this.  She had SI with plan of overdosing, although she did not act on this.  Of note, she states that she was taking Vyvanse 10  mg twice a week 2 weeks prior to the admission.  She states that she felt confused, during the admission, states that it was weird.  She hears different opinions about Abilify .  Although she does remember that she wanted to take Abilify  (she states that otherwise she would not be able to get out of the hospital), she discontinued this medication after discharge/having noticed she tried this medication before.  She believes it made her feel super irritable.  She has been taking fluoxetine  and trazodone  only.  She wen to [facility] yesterday.  She states that her mother did not want her to continue to come to this practice.  She has been feeling depressed.  She is but she feels the day 2 days after discharge, feeling blah.  She was also feeling anxious as she had to withdraw from summer class.  She also missed work, and which was bothering to her.  However, she feels more hopeful.  She feels excited to start school in August.  She sleeps up to 7 hours, stating that she is always able to sleep.  She has more energy.  She denies SI (except two days after discharge), hallucinations.  She denies decreased need for sleep, euphoria.  She has flashback about the past, ruminating what she could have done.  She feels remorse, anger and sadness.   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 Bipolar I disorder (HCC)  F31.9     2. Insomnia, unspecified  type  G47.00       Past Psychiatric History: Please see initial evaluation for full details. I have reviewed the history. No updates at this time.     Past Medical History:  Past Medical History:  Diagnosis Date   Acne 01/28/2017   Acute drug intoxication with perceptual disturbance (HCC) 09/13/2014   Anxiety    Bipolar disorder (HCC)    Bipolar II disorder (HCC) 06/25/2016   Depression    Family history of ovarian cancer    GERD (gastroesophageal reflux disease) 01/28/2017   Hemorrhoid 01/16/2018   History of head injury 09/23/2014   Overview:  Patient had TV fall on her head at age 3   Mania (monopolar) single episode or unspecified 09/23/2014   Overview:  Unclear if substance induced or true Bipolar Type I   Sedative, hypnotic or anxiolytic use disorder, mild, 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 I have reviewed the history. No updates at this time.     Family History:  Family History  Problem Relation Age of Onset   Depression Mother    Anxiety disorder Mother    Bipolar disorder Mother    Hypertension Mother    Asthma Father    Alcohol abuse Father    Sleep disorder Brother    Hypertension Maternal Grandmother    Bipolar disorder Maternal Grandfather    Ovarian cancer Paternal Grandmother     Social History:  Social History   Socioeconomic History   Marital status: Single    Spouse name: Not on file   Number of children: 1   Years of education: Not on file   Highest education level: Some college, no degree  Occupational History   Not on file  Tobacco Use   Smoking status: Former    Current packs/day: 0.10    Average packs/day: 0.1 packs/day for 9.7 years (1.0 ttl pk-yrs)    Types: E-cigarettes, Cigarettes     Start date: 10/03/2014   Smokeless tobacco: Never  Vaping Use   Vaping status: Former  Substance and Sexual Activity   Alcohol use: Yes    Alcohol/week: 5.0 - 7.0 standard drinks of alcohol    Types: 2 - 3 Glasses of wine, 3 - 4 Cans of beer per week    Comment: 2-3 times per month   Drug use: Yes    Frequency: 1.0 times per week    Types: Marijuana    Comment: only using marijuana- other drugs no longer using   Sexual activity: Yes    Partners: Male  Birth control/protection: Condom  Other Topics Concern   Not on file  Social History Narrative   Not on file   Social Drivers of Health   Financial Resource Strain: Low Risk  (08/04/2023)   Received from [facility]   Overall Financial Resource Strain (CARDIA)    Difficulty of Paying Living Expenses: Not very hard  Food Insecurity: No Food Insecurity (05/22/2024)   Hunger Vital Sign    Worried About Running Out of Food in the Last Year: Never true    Ran Out of Food in the Last Year: Never true  Transportation Needs: No Transportation Needs (05/22/2024)   PRAPARE - Administrator, Civil Service (Medical): No    Lack of Transportation (Non-Medical): No  Physical Activity: Inactive (05/08/2018)   Exercise Vital Sign    Days of Exercise per Week: 0 days    Minutes of Exercise per Session: 0 min  Stress: Not on file  Social Connections: Not on file    Allergies:  Allergies  Allergen Reactions   Hydrocodone Hives   Latex Swelling    Swelling of lips and rash   Lamictal [Lamotrigine] Rash   Tramadol Rash    Metabolic Disorder Labs: Lab Results  Component Value Date   HGBA1C 5.0 05/22/2024   MPG 96.8 05/22/2024   Lab Results  Component Value Date   PROLACTIN 43.9 (H) 06/26/2016   Lab Results  Component Value Date   CHOL 184 05/24/2024   TRIG 362 (H) 05/24/2024   HDL 34 (L) 05/24/2024   CHOLHDL 5.4 05/24/2024   VLDL 72 (H) 05/24/2024   LDLCALC 78 05/24/2024   LDLCALC 135 (H)  06/26/2016   Lab Results  Component Value Date   TSH 2.764 05/24/2024   TSH 7.039 (H) 05/22/2024    Therapeutic Level Labs: No results found for: LITHIUM No results found for: VALPROATE No results found for: CBMZ  Current Medications: Current Outpatient Medications  Medication Sig Dispense Refill   ARIPiprazole  (ABILIFY ) 10 MG tablet Take 1 tablet (10 mg total) by mouth daily. For mood control 30 tablet 0   FLUoxetine  (PROZAC ) 10 MG capsule Take 1 capsule (10 mg total) by mouth daily. For depression. 10 capsule 0   hydrOXYzine  (ATARAX ) 50 MG tablet Take 1 tablet (50 mg total) by mouth every 6 (six) hours as needed for anxiety. 75 tablet 0   nicotine  (NICODERM CQ  - DOSED IN MG/24 HOURS) 14 mg/24hr patch Place 1 patch (14 mg total) onto the skin daily at 6 (six) AM. (May buy from over the counter): For smoking cessation.     nicotine  polacrilex (NICORETTE ) 2 MG gum Take 1 each (2 mg total) by mouth as needed. (May buy from over the counter): For smoking cessation.     traZODone  (DESYREL ) 100 MG tablet Take 1 tablet (100 mg total) by mouth at bedtime. For sleep. 30 tablet 0   No current facility-administered medications for this visit.     Musculoskeletal: Strength & Muscle Tone: within normal limits Gait & Station: normal Patient leans: N/A  Psychiatric Specialty Exam: Review of Systems  Psychiatric/Behavioral:  Positive for decreased concentration, dysphoric mood, sleep disturbance and suicidal ideas. Negative for agitation, behavioral problems, confusion, hallucinations and self-injury. The patient is nervous/anxious. The patient is not hyperactive.   All other systems reviewed and are negative.   Blood pressure 122/84, pulse 100, temperature 97.8 F (36.6 C), temperature source Temporal, height 5' 3 (1.6 m), weight 179 lb 6.4 oz (81.4  kg), last menstrual period 05/03/2024, SpO2 99%.Body mass index is 31.78 kg/m.  General Appearance: Well Groomed  Eye Contact:  Good   Speech:  Clear and Coherent  Volume:  Normal  Mood:  Depressed  Affect:  Appropriate, Congruent, and Full Range  Thought Process:  Coherent  Orientation:  Full (Time, Place, and Person)  Thought Content: Logical   Suicidal Thoughts:  No  Homicidal Thoughts:  No  Memory:  Immediate;   Good  Judgement:  Good  Insight:  Good  Psychomotor Activity:  Normal  Concentration:  Concentration: Good and Attention Span: Good  Recall:  Good  Fund of Knowledge: Good  Language: Good  Akathisia:  No  Handed:  Right  AIMS (if indicated): not done  Assets:  Communication Skills Desire for Improvement  ADL's:  Intact  Cognition: WNL  Sleep:  Fair   Screenings: AIMS    Flowsheet Row [facility] Ad[facility] (Discharged) from 06/25/2016 in [facility]  AIMS Total Score 0 0   AUDIT    Flowsheet Row Ad[facility]  Alcohol Use Disorder Identification Test Final Score (AUDIT) 2   GAD-7    Flowsheet Row Office Visit from 06/03/2024 in [facility] Office Visit from 04/22/2024 in [facility] Office Visit from 12/22/2023 in [facility] Office Visit from 10/23/2023 in [facility] Office Visit from 10/24/2022 in [facility]  Total GAD-7 Score 9 14 4 13 7    PHQ2-9    Flowsheet Row Office Visit from 06/03/2024 in [facility] Office Visit from 04/22/2024 in [facility] Office Visit from 12/22/2023 in [facility] Office Visit from 10/23/2023 in [facility] Office Visit from 10/24/2022 in  [facility]  PHQ-2 Total Score 4 3 2 2 3   PHQ-9 Total Score 19 16 9 16 14    Flowsheet Row Office Visit from 06/03/2024 in [facility] Most recent reading at 06/03/2024  3:02 PM [facility] ([facility] Most recent reading at 05/22/2024  5:00 PM ED from 05/22/2024 in [facility] Most recent reading at 05/22/2024  2:48 AM  C-SSRS RISK CATEGORY Error: Q3, 4, or 5 should not be populated when Q2 is No Low Risk High Risk     Assessment and Plan:  Holly Farrell is a 28 y.o. year old female with a history of bipolar disorder, likely substance induced, marijuana use disorder, polysubstance use disorder, GERD, who presents for follow-up appointment for below.   1. Bipolar I disorder (HCC) most  # r/o rapid cycling # r/o PTSD History: OD ibuprofen at age 56, OD xanax at age 49 in the context of drug use  She had psychiatry admission due to worsening in severe depression with psychotic symptoms with SI without intent.  She continues to experience depression, and self-discontinued Abilify  after she recognizes the past trial of this medication.  Noted that she reports manic episode with significant irritability, racing thoughts, increased goal-directed activity, excessive involvement in risky behaviors, which lasted for a week preceded to depressive episode. Discussed treatment options including lithium. In retrospect, the patient reported some benefit from the lower dose  of Latuda . She would like to try this again as she was not aware of taking medication at least 350 kcal to ensure absorption, and she is not sure if taking Vyvanse a few times per week was causing more issues on her mood.  She is receptive to try lithium if this does not work.  Will continue fluoxetine  at this time to target bipolar depression off label, and a possible PTSD  as she reported that her family members have responded well to this medication.  Discussed medication induced mania.  Will continue hydroxyzine  as needed for anxiety.  Noted that there is a concern of medication adherence.  Provided psychoeducation at length regarding the importance of consistent medication use,  and an overview of the chronic and recurrent nature of bipolar disorder. Will continue to assess closely.   2. Insomnia, unspecified type She reports drowsiness from trazodone  100 mg, and has middle insomnia from 50 mg.  Will try the middle dose to target insomnia.   # follow up She was seen at [facility] yesterday.  She states that her mother would like her to change the provider/practice, although she reports interest in continuing to come to this practice.  She was informed of the importance of maintaining care with one provider and one therapist to support continuity and consistency of treatment.    4. High risk medication use   Last checked  EKG HR 59, QTc439msec 04/2024  Lipid panels TG 362H, LDL 78 05/2024  HbA1c 5 04/2024      5. Marijuana dependence (HCC) # binge alcohol use   She has been abstinent from marijuana use.  Discussed potential long-term risk.  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    Plan Start Latuda  20 mg daily for one week, then 40 mg daily (she agrees to try lithium if it does not work. Will recheck TSH given recent abnormality) Continue fluoxetine  10 mg daily  Increase trazodone  75 mg at night - she will call if she needs a refill Continue hydroxyzine  50 mg daily as needed for anxiety Next appointment: 7/22 at 3:30, IP, and wait list for sooner visit Referred for evaluation of ADHD Referred for therapy   Past trials of medication- Abilify , Quetiapine  (Zombie), Latuda  (less emotions), lamotrigine (rash),    The patient demonstrates the following risk factors for suicide: Chronic risk factors for suicide include: psychiatric disorder of depression, substance  use disorder, and previous suicide attempts of overdosing medication . Acute risk factors for suicide include: family or marital conflict and unemployment. Protective factors for this patient include: responsibility to others (children, family) and hope for the future. Considering these factors, the overall suicide risk at this point appears to be low. Patient is appropriate for outpatient follow up.   A total of 60 minutes was spent on the following activities during the encounter date, which includes but is not limited to: preparing to see the patient (e.g., reviewing tests and records), obtaining and/or reviewing separately obtained history, performing a medically necessary examination or evaluation, counseling and educating the patient, family, or caregiver, ordering medications, tests, or procedures, referring and communicating with other healthcare professionals (when not reported separately), documenting clinical information in the electronic or paper health record, independently interpreting test or lab results and communicating these results to the family or caregiver, and coordinating care (when not reported separately).   Collaboration of Care: Collaboration of Care: Other reviewed notes in Epic  Patient/Guardian was advised Release of Information must be obtained prior to any record release in  order to collaborate their care with an outside provider. Patient/Guardian was advised if they have not already done so to contact the registration department to sign all necessary forms in order for us  to release information regarding their care.   Consent: Patient/Guardian gives verbal consent for treatment and assignment of benefits for services provided during this visit. Patient/Guardian expressed understanding and agreed to proceed.    Todd Fossa, MD 06/03/2024, 3:03 PM

## 2024-06-03 NOTE — Patient Instructions (Signed)
 Start Latuda  20 mg daily for one week, then 40 mg daily  Continue fluoxetine  10 mg daily  Increase trazodone  75 mg at night  Next appointment: 7/22 at 3:30

## 2024-06-03 NOTE — Telephone Encounter (Signed)
 Patient called to schedule her appointment. Discussed our office was notified she was seeking a new provider so appointments were cancelled. She said that was a mistake and she does want to continue care here. There was an opening and she scheduled.

## 2024-06-04 NOTE — Progress Notes (Signed)
 Complex Care Management Note  Care Guide Note 06/04/2024 Name: JABREE PERNICE MRN: [id number] DOB: 07-14-1996  KEENA DINSE is a 28 y.o. year old female who sees [facility] for primary care. I reached out to [name] by phone today to offer complex care management services.  Ms. Orona was given information about Complex Care Management services today including:   The Complex Care Management services include support from the care team which includes your Nurse Care Manager, Clinical Social Worker, or Pharmacist.  The Complex Care Management team is here to help remove barriers to the health concerns and goals most important to you. Complex Care Management services are voluntary, and the patient may decline or stop services at any time by request to their care team member.   Complex Care Management Consent Status: Patient did not agree to participate in complex care management services at this time.  Follow up plan:  Patient will contact payor for additional options.  Encounter Outcome:  Patient Refused  Cayetano Coco [facility], [facility] Health Care Management Assistant Direct Dial: [phone]  Fax: [phone]

## 2024-06-07 ENCOUNTER — Telehealth: Admitting: Psychiatry

## 2024-06-07 ENCOUNTER — Encounter: Payer: Self-pay | Admitting: Psychiatry

## 2024-06-07 DIAGNOSIS — Z79899 Other long term (current) drug therapy: Secondary | ICD-10-CM | POA: Diagnosis not present

## 2024-06-07 DIAGNOSIS — F319 Bipolar disorder, unspecified: Secondary | ICD-10-CM

## 2024-06-07 DIAGNOSIS — Z5181 Encounter for therapeutic drug level monitoring: Secondary | ICD-10-CM | POA: Diagnosis not present

## 2024-06-07 DIAGNOSIS — G4700 Insomnia, unspecified: Secondary | ICD-10-CM | POA: Diagnosis not present
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
## 2024-06-11 NOTE — Progress Notes (Unsigned)
 Virtual Visit via Video Note  I connected with Holly Farrell on 06/16/24 at 10:30 AM EDT by a video enabled telemedicine application and verified that I am speaking with the correct person using two identifiers.  Location: Patient: home Provider: home office Persons participated in the visit- patient, provider    I discussed the limitations of evaluation and management by telemedicine and the availability of in person appointments. The patient expressed understanding and agreed to proceed.   I discussed the assessment and treatment plan with the patient. The patient was provided an opportunity to ask questions and all were answered. The patient agreed with the plan and demonstrated an understanding of the instructions.   The patient was advised to call back or seek an in-person evaluation if the symptoms worsen or if the condition fails to improve as anticipated.   Holly Sleet, MD    [facility] MD/PA/NP OP Progress Note  06/16/2024 12:20 PM Holly Farrell  MRN:  [id number]  Chief Complaint:  Chief Complaint  Patient presents with   Follow-up   HPI:  This is a follow-up appointment for bipolar 1 disorder.  She presents to the visit with her mother.  She states that she feels depressed.  She has trouble getting out of the bed due to low energy.  She had SI, although she denies any plan or intent.  She states that the work is boring and she does not like it anymore.  She has a difficult time in bathing, and this is the first day she has taken a shower after a while.  She reports worsening in anxiety.  She has racing thoughts about random things, including what she does in life, her daughter, things about the past and the future.  Although she has hypersomnia, she does not have a good quality of sleep.  She took hydroxyzine , which has helped for anxiety.  Although she had some vivid dreams, she denies any recent nightmares.  She denies HI, hallucinations, paranoia. Her mother helps her  daughter as she cannot pay good attention to her.  She thinks she is doing worse with Latuda .  She would like to be back on Lexapro.  Although she thinks it may have caused weight gain, she thinks it helped her.  She is concerned about getting frequent blood test if she were to be on lithium.   Her mother presents to the visit.  She states that she is concerned as [name] is not alert. She is sad and blue. She feels that [name] is not herself, and is like a Zombie. She is concerned that either abilify , latuda  has not helped her.   After discussing the treatment options at length, they both agreed to try Vraylar.  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 Bipolar I disorder (HCC)  F31.9       Past Psychiatric History: Please see initial evaluation for full details. I have reviewed the history. No updates at this time.     Past Medical History:  Past Medical History:  Diagnosis Date   Acne 01/28/2017  Acute drug intoxication with perceptual disturbance (HCC) 09/13/2014   Anxiety    Bipolar disorder (HCC)    Bipolar II disorder (HCC) 06/25/2016   Depression    Family history of ovarian cancer    GERD (gastroesophageal reflux disease) 01/28/2017   Hemorrhoid 01/16/2018   History of head injury 09/23/2014   Overview:  Patient had TV fall on her head at age 38   Mania (monopolar) single episode or unspecified 09/23/2014   Overview:  Unclear if substance induced or true Bipolar Type I   Sedative, hypnotic or anxiolytic use disorder, mild, 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 I have reviewed the history. No updates at this time.     Family History:  Family History  Problem Relation Age of Onset   Depression Mother    Anxiety disorder Mother    Bipolar disorder Mother    Hypertension Mother    Asthma Father    Alcohol abuse Father    Sleep disorder Brother    Hypertension Maternal Grandmother    Bipolar disorder Maternal Grandfather    Ovarian cancer Paternal Grandmother     Social History:  Social History   Socioeconomic History   Marital status: Single    Spouse name: Not on file   Number of children: 1   Years of education: Not on file   Highest education level: Some college, no degree  Occupational History   Not on file  Tobacco Use   Smoking status: Former    Current packs/day: 0.10    Average packs/day: 0.1 packs/day for 9.7 years (1.0 ttl pk-yrs)    Types: E-cigarettes, Cigarettes    Start date: 10/03/2014   Smokeless tobacco: Never  Vaping Use   Vaping status: Former  Substance and Sexual Activity   Alcohol use: Yes    Alcohol/week: 5.0 - 7.0 standard drinks of alcohol    Types: 2 - 3 Glasses of wine, 3 - 4 Cans of beer per week    Comment: 2-3 times per month   Drug use: Yes    Frequency: 1.0 times per week    Types: Marijuana    Comment: only using marijuana- other drugs no longer using   Sexual activity: Yes    Partners: Male    Birth control/protection: Condom  Other Topics Concern   Not on file  Social History Narrative   Not on file   Social Drivers of Health   Financial Resource Strain: Low Risk  (08/04/2023)   Received from [facility]   Overall Financial Resource Strain (CARDIA)    Difficulty of Paying Living Expenses: Not very hard  Food Insecurity: No Food Insecurity (05/22/2024)   Hunger Vital Sign     Worried About Running Out of Food in the Last Year: Never true    Ran Out of Food in the Last Year: Never true  Transportation Needs: No Transportation Needs (05/22/2024)   PRAPARE - Administrator, Civil Service (Medical): No    Lack of Transportation (Non-Medical): No  Physical Activity: Inactive (05/08/2018)   Exercise Vital Sign    Days of Exercise per Week: 0 days    Minutes of Exercise per Session: 0 min  Stress:  Not on file  Social Connections: Not on file    Allergies:  Allergies  Allergen Reactions   Hydrocodone Hives   Latex Swelling    Swelling of lips and rash   Lamictal [Lamotrigine] Rash   Tramadol Rash    Metabolic Disorder Labs: Lab Results  Component Value Date   HGBA1C 5.0 05/22/2024   MPG 96.8 05/22/2024   Lab Results  Component Value Date   PROLACTIN 43.9 (H) 06/26/2016   Lab Results  Component Value Date   CHOL 184 05/24/2024   TRIG 362 (H) 05/24/2024   HDL 34 (L) 05/24/2024   CHOLHDL 5.4 05/24/2024   VLDL 72 (H) 05/24/2024   LDLCALC 78 05/24/2024   LDLCALC 135 (H) 06/26/2016   Lab Results  Component Value Date   TSH 2.764 05/24/2024   TSH 7.039 (H) 05/22/2024    Therapeutic Level Labs: No results found for: LITHIUM No results found for: VALPROATE No results found for: CBMZ  Current Medications: Current Outpatient Medications  Medication Sig Dispense Refill   cariprazine (VRAYLAR) 1.5 MG capsule Take 1 capsule (1.5 mg total) by mouth daily. 30 capsule 0   [START ON 06/27/2024] FLUoxetine  (PROZAC ) 10 MG capsule Take 1 capsule (10 mg total) by mouth daily. For depression. 30 capsule 0   hydrOXYzine  (ATARAX ) 50 MG tablet Take 1 tablet (50 mg total) by mouth every 6 (six) hours as needed for anxiety. 75 tablet 0   nicotine  (NICODERM CQ  - DOSED IN MG/24 HOURS) 14 mg/24hr patch Place 1 patch (14 mg total) onto the skin daily at 6 (six) AM. (May buy from over the counter): For smoking cessation.     nicotine  polacrilex  (NICORETTE ) 2 MG gum Take 1 each (2 mg total) by mouth as needed. (May buy from over the counter): For smoking cessation.     No current facility-administered medications for this visit.     Musculoskeletal: Strength & Muscle Tone: N/A Gait & Station: N/A Patient leans: N/A  Psychiatric Specialty Exam: Review of Systems  Psychiatric/Behavioral:  Positive for dysphoric mood, sleep disturbance and suicidal ideas. Negative for agitation, behavioral problems, confusion, decreased concentration, hallucinations and self-injury. The patient is nervous/anxious. The patient is not hyperactive.   All other systems reviewed and are negative.   Last menstrual period 05/03/2024.There is no height or weight on file to calculate BMI.  General Appearance: Well Groomed  Eye Contact:  Good  Speech:  Clear and Coherent  Volume:  Normal  Mood:  Depressed  Affect:  Appropriate, Congruent, and fatigue  Thought Process:  Coherent  Orientation:  Full (Time, Place, and Person)  Thought Content: Logical   Suicidal Thoughts:  Yes.  without intent/plan  Homicidal Thoughts:  No  Memory:  Immediate;   Good  Judgement:  Good  Insight:  Good  Psychomotor Activity:  Normal  Concentration:  Concentration: Good and Attention Span: Good  Recall:  Good  Fund of Knowledge: Good  Language: Good  Akathisia:  No  Handed:  Right  AIMS (if indicated): not done  Assets:  Communication Skills Desire for Improvement  ADL's:  Intact  Cognition: WNL  Sleep:  hypersomnia   Screenings: AIMS    Flowsheet Row [facility] (Discharged) from 06[facility]25/2016 in [facility]  AIMS Total Score 0 0   AUDIT    Flowsheet Row [facility]  Alcohol Use Disorder Identification Test  Final Score (AUDIT) 2   GAD-7    Flowsheet Row Office Visit from  06/03/2024 in [facility] Office Visit from 04/22/2024 in [facility] Office Visit from 12/22/2023 in [facility] Office Visit from 10/23/2023 in [facility] Office Visit from 10/24/2022 in [facility]  Total GAD-7 Score 9 14 4 13 7    PHQ2-9    Flowsheet Row Office Visit from 06/03/2024 in [facility] Office Visit from 04/22/2024 in [facility] Office Visit from 12/22/2023 in [facility] Office Visit from 10/23/2023 in [facility] Office Visit from 10/24/2022 in [facility]  PHQ-2 Total Score 4 3 2 2 3   PHQ-9 Total Score 19 16 9 16 14    Flowsheet Row Office Visit from 06/03/2024 in [facility] Most recent reading at 06/03/2024  3:02 PM [facility] ([facility] Most recent reading at 05/22/2024  5:00 PM ED from 05/22/2024 in [facility] Most recent reading at 05/22/2024  2:48 AM  C-SSRS RISK CATEGORY Error: Q3, 4, or 5 should not be populated when Q2 is No Low Risk High Risk     Assessment and Plan:  Holly Farrell is a 28 y.o. year old female with a history of bipolar disorder, likely substance induced, marijuana use disorder, polysubstance use disorder, GERD, who presents for follow-up appointment for below.    1. Bipolar I disorder (HCC) # r/o rapid cycling # r/o PTSD History: OD ibuprofen at age 30, OD xanax at age 80 in the context of drug use, psych admission June 2025 for worsening in severe depression with psychotic symptoms with SI without intent. She reports manic  episode with significant irritability, racing thoughts, increased goal-directed activity, excessive involvement in risky behaviors, which lasted for a week preceded to depressive episode.  She continues to experience significant exhaustion, depressive symptoms with occasional passive SI, although she denies any manic symptoms since the last visit.  Both the patient and her mother reports limited benefit from Latuda , and are concerned of worsening in her symptoms.  Will start Vraylar to target bipolar disorder.  Noted that although lithium was recommended, she feels wary of getting blood test monitoring. Vraylar is chosen due to relatively lower risk of metabolic side effect.  Discussed potential metabolic side effect, EPS and QTc prolongation.  Will continue current dose of fluoxetine  at this time to target bipolar depression, off label and to target possible PTSD symptoms.  Discussed potential risk of medication-induced mania.  Will continue hydroxyzine  as needed for anxiety.     4. High risk medication use     Last checked  EKG HR 59, QTc430msec 04/2024  Lipid panels TG 362H, LDL 78 05/2024  HbA1c 5 04/2024      5. Marijuana dependence (HCC) # binge alcohol use   She has been abstinent from both alcohol and marijuana since the previous visit.  Although she was recommended for pharmacological treatment, she is not interested in this at this time.  Will continue motivational interview.   Plan Discontinue latuda  - was on 40 mg  Start Vraylar 1.5 mg daily  Continue fluoxetine  10 mg daily  Continue hydroxyzine  50 mg daily as needed for anxiety Next appointment: 7/29 at 1 pm,  video, 7/22 at 3:30, IP, and wait list for sooner visit Referred for evaluation of ADHD Referred for therapy    Past trials of medication- Abilify , Quetiapine  (Zombie), Latuda  (less emotions), lamotrigine (rash),    The patient demonstrates the following risk factors for suicide: Chronic risk factors for suicide include:  psychiatric disorder of depression, substance use disorder, and previous suicide attempts of overdosing medication . Acute risk factors for suicide include: family or marital conflict. Protective factors for this patient include: responsibility to others (children, family) and hope for the future. Considering these factors, the overall suicide risk at this point appears to be moderate, but not at imminent risk. She denies gun access at home. Patient is appropriate for outpatient follow up. Emergency resources which includes 911, ED, suicide crisis line (988) are discussed.    This visit involved a longitudinal and complex condition requiring extended medical decision-making, coordination of care, and management beyond what is typically captured in CPT 99214. The complexity of the patient's condition justifies the use of G2211.   Collaboration of Care: Collaboration of Care: Other reviewed notes in Epic, discussed plans with her mother  Patient/Guardian was advised Release of Information must be obtained prior to any record release in order to collaborate their care with an outside provider. Patient/Guardian was advised if they have not already done so to contact the registration department to sign all necessary forms in order for us  to release information regarding their care.   Consent: Patient/Guardian gives verbal consent for treatment and assignment of benefits for services provided during this visit. Patient/Guardian expressed understanding and agreed to proceed.    Holly Sleet, MD 06/16/2024, 12:20 PM

## 2024-06-16 ENCOUNTER — Encounter: Payer: Self-pay | Admitting: Psychiatry

## 2024-06-16 ENCOUNTER — Telehealth: Payer: Self-pay

## 2024-06-16 ENCOUNTER — Telehealth (INDEPENDENT_AMBULATORY_CARE_PROVIDER_SITE_OTHER): Admitting: Psychiatry

## 2024-06-16 DIAGNOSIS — F319 Bipolar disorder, unspecified: Secondary | ICD-10-CM | POA: Diagnosis not present

## 2024-06-16 MED ORDER — CARIPRAZINE HCL 1.5 MG PO CAPS: 1.5000 mg | ORAL_CAPSULE | Freq: Every day | ORAL | 0 refills | Status: AC

## 2024-06-16 NOTE — Patient Instructions (Signed)
 Discontinue latuda   Start Vraylar 1.5 mg daily  Continue fluoxetine  10 mg daily  Continue hydroxyzine  50 mg daily as needed for anxiety Next appointment: 7/29 at 1 pm, video

## 2024-06-16 NOTE — Telephone Encounter (Signed)
 went online to [email or web address] and submitted the prior auth . - pending

## 2024-06-16 NOTE — Telephone Encounter (Signed)
 received notice that a prior auth was needed for the vraylar.

## 2024-06-18 NOTE — Telephone Encounter (Signed)
 received notice that the prior auth was approved until 12-22-25

## 2024-06-18 NOTE — Telephone Encounter (Signed)
 notified pharmacy that prior auth was approved until 12-22-24 for the vraylar .SABRA

## 2024-06-18 NOTE — Telephone Encounter (Signed)
 left message that rx was sent to the pharmacy and to check with them later this afternoon.

## 2024-06-19 NOTE — Progress Notes (Unsigned)
 Virtual Visit via Video Note  I connected with Holly Farrell on 06/23/24 at 10:00 AM EDT by a video enabled telemedicine application and verified that I am speaking with the correct person using two identifiers.  Location: Patient: home Provider: home office Persons participated in the visit- patient, provider    I discussed the limitations of evaluation and management by telemedicine and the availability of in person appointments. The patient expressed understanding and agreed to proceed.    I discussed the assessment and treatment plan with the patient. The patient was provided an opportunity to ask questions and all were answered. The patient agreed with the plan and demonstrated an understanding of the instructions.   The patient was advised to call back or seek an in-person evaluation if the symptoms worsen or if the condition fails to improve as anticipated.  Katheren Sleet, MD    [facility] MD/PA/NP OP Progress Note  06/23/2024 10:41 AM SHARICKA POGORZELSKI  MRN:  [id number]  Chief Complaint:  Chief Complaint  Patient presents with   Follow-up   HPI:  This is a follow-up appointment for bipolar 1 disorder.  She states that she feels tired.  However, she does go out and do things since the previous visit.  She believes her mood is at least not worse, and not as sad.  She goes to work, and it has been okay although it is boring.  She does not want to be there.  She feels bad as she does not want to do much things for her daughter.  Although [place], she felt [place].  She had a conflict with her mother, although she does not recall what they were arguing about.  Her mother tends to talk a lot, and is overbearing and unpredictable.  However, she denies any concern about irritability as she used to in the past.  She has middle insomnia.  She has vivid dream, although she denies any trauma related to this.  She is anxious about the future, although she denies panic attacks.   She has not taken hydroxyzine .  She has increase in appetite, although she has not measured weight.  She denies SI, HI, hallucinations.  She denies decreased need for sleep or euphoria.  She denies increased goal-directed activity.  She agrees with the plans as outlined below.   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 Bipolar I disorder (HCC)  F31.9     2. High risk medication use  Z79.899     3. Weight gain due to medication  R63.5    T50.905A       Past Psychiatric History: Please see initial evaluation for full details. I have reviewed the history. No updates at this time.     Past Medical History:  Past Medical History:  Diagnosis Date   Acne 01/28/2017   Acute drug intoxication with perceptual disturbance (HCC) 09/13/2014   Anxiety    Bipolar disorder (HCC)    Bipolar II disorder (HCC) 06/25/2016   Depression    Family history of ovarian cancer  GERD (gastroesophageal reflux disease) 01/28/2017   Hemorrhoid 01/16/2018   History of head injury 09/23/2014   Overview:  Patient had TV fall on her head at age 75   Mania (monopolar) single episode or unspecified 09/23/2014   Overview:  Unclear if substance induced or true Bipolar Type I   Sedative, hypnotic or anxiolytic use disorder, mild, 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 I have reviewed the history. No updates at this time.      Family History:  Family History  Problem Relation Age of Onset   Depression Mother    Anxiety disorder Mother    Bipolar disorder Mother    Hypertension Mother    Asthma Father    Alcohol abuse Father    Sleep disorder Brother    Hypertension Maternal Grandmother    Bipolar disorder Maternal Grandfather    Ovarian cancer Paternal Grandmother     Social History:  Social History   Socioeconomic History   Marital status: Single    Spouse name: Not on file   Number of children: 1   Years of education: Not on file   Highest education level: Some college, no degree  Occupational History   Not on file  Tobacco Use   Smoking status: Former    Current packs/day: 0.10    Average packs/day: 0.1 packs/day for 9.7 years (1.0 ttl pk-yrs)    Types: E-cigarettes, Cigarettes    Start date: 10/03/2014   Smokeless tobacco: Never  Vaping Use   Vaping status: Former  Substance and Sexual Activity   Alcohol use: Yes    Alcohol/week: 5.0 - 7.0 standard drinks of alcohol    Types: 2 - 3 Glasses of wine, 3 - 4 Cans of beer per week    Comment: 2-3 times per month   Drug use: Yes    Frequency: 1.0 times per week    Types: Marijuana    Comment: only using marijuana- other drugs no longer using   Sexual activity: Yes    Partners: Male    Birth control/protection: Condom  Other Topics Concern   Not on file  Social History Narrative   Not on file   Social Drivers of Health   Financial Resource Strain: Low Risk  (08/04/2023)   Received from [facility]   Overall Financial Resource Strain (CARDIA)    Difficulty of Paying Living Expenses: Not very hard  Food Insecurity: No Food Insecurity (05/22/2024)   Hunger Vital Sign    Worried About Running Out of Food in the Last Year: Never true    Ran Out of Food in the Last Year: Never true  Transportation Needs: No Transportation Needs (05/22/2024)   PRAPARE - Administrator, Civil Service (Medical): No     Lack of Transportation (Non-Medical): No  Physical Activity: Inactive (05/08/2018)   Exercise Vital Sign    Days of Exercise per Week: 0 days    Minutes of Exercise per Session: 0 min  Stress: Not on file  Social Connections: Not on file    Allergies:  Allergies  Allergen Reactions   Hydrocodone Hives   Latex Swelling    Swelling of lips and rash   Lamictal [Lamotrigine] Rash  Tramadol Rash    Metabolic Disorder Labs: Lab Results  Component Value Date   HGBA1C 5.0 05/22/2024   MPG 96.8 05/22/2024   Lab Results  Component Value Date   PROLACTIN 43.9 (H) 06/26/2016   Lab Results  Component Value Date   CHOL 184 05/24/2024   TRIG 362 (H) 05/24/2024   HDL 34 (L) 05/24/2024   CHOLHDL 5.4 05/24/2024   VLDL 72 (H) 05/24/2024   LDLCALC 78 05/24/2024   LDLCALC 135 (H) 06/26/2016   Lab Results  Component Value Date   TSH 2.764 05/24/2024   TSH 7.039 (H) 05/22/2024    Therapeutic Level Labs: No results found for: LITHIUM No results found for: VALPROATE No results found for: CBMZ  Current Medications: Current Outpatient Medications  Medication Sig Dispense Refill   metFORMIN (GLUCOPHAGE) 500 MG tablet Take 1 tablet (500 mg total) by mouth at bedtime. 30 tablet 0   cariprazine  (VRAYLAR ) 1.5 MG capsule Take 1 capsule (1.5 mg total) by mouth daily. 30 capsule 0   [START ON 06/27/2024] FLUoxetine  (PROZAC ) 10 MG capsule Take 1 capsule (10 mg total) by mouth daily. For depression. 30 capsule 0   hydrOXYzine  (ATARAX ) 50 MG tablet Take 1 tablet (50 mg total) by mouth every 6 (six) hours as needed for anxiety. 75 tablet 0   nicotine  (NICODERM CQ  - DOSED IN MG/24 HOURS) 14 mg/24hr patch Place 1 patch (14 mg total) onto the skin daily at 6 (six) AM. (May buy from over the counter): For smoking cessation.     nicotine  polacrilex (NICORETTE ) 2 MG gum Take 1 each (2 mg total) by mouth as needed. (May buy from over the counter): For smoking cessation.     No current  facility-administered medications for this visit.     Musculoskeletal: Strength & Muscle Tone: N/A Gait & Station: N/A Patient leans: N/A  Psychiatric Specialty Exam: Review of Systems  Psychiatric/Behavioral:  Positive for decreased concentration, dysphoric mood and sleep disturbance. Negative for agitation, behavioral problems, confusion, hallucinations, self-injury and suicidal ideas. The patient is nervous/anxious. The patient is not hyperactive.   All other systems reviewed and are negative.   Last menstrual period 05/03/2024.There is no height or weight on file to calculate BMI.  General Appearance: Well Groomed  Eye Contact:  Good  Speech:  Clear and Coherent  Volume:  Normal  Mood:  tired  Affect:  Appropriate, Congruent, and fatigue  Thought Process:  Coherent  Orientation:  Full (Time, Place, and Person)  Thought Content: Logical   Suicidal Thoughts:  No  Homicidal Thoughts:  No  Memory:  Immediate;   Good  Judgement:  Good  Insight:  Good  Psychomotor Activity:  Normal  Concentration:  Concentration: Good and Attention Span: Good  Recall:  Good  Fund of Knowledge: Good  Language: Good  Akathisia:  No  Handed:  Right  AIMS (if indicated): not done  Assets:  Communication Skills Desire for Improvement  ADL's:  Intact  Cognition: WNL  Sleep:  Poor   Screenings: AIMS    Flowsheet Row [facility] Ad[facility] (Discharged) from 06/25/2016 in [facility]  AIMS Total Score 0 0   AUDIT    Flowsheet Row [facility]  Alcohol Use Disorder Identification Test Final Score (AUDIT) 2   GAD-7    Flowsheet Row Office Visit from 06/03/2024 in [facility] Office  Visit from 04/22/2024 in [facility] Office Visit from 12/22/2023  in [facility] Office Visit from 10/23/2023 in [facility] Office Visit from 10/24/2022 in [facility]  Total GAD-7 Score 9 14 4 13 7    PHQ2-9    Flowsheet Row Office Visit from 06/03/2024 in [facility] Office Visit from 04/22/2024 in [facility] Office Visit from 12/22/2023 in [facility] Office Visit from 10/23/2023 in [facility] Office Visit from 10/24/2022 in [facility]  PHQ-2 Total Score 4 3 2 2 3   PHQ-9 Total Score 19 16 9 16 14    Flowsheet Row Office Visit from 06/03/2024 in [facility] Most recent reading at 06/03/2024  3:02 PM [facility] Most recent reading at 05/22/2024  5:00 PM ED from 05/22/2024 in [facility] Most recent reading at 05/22/2024  2:48 AM  C-SSRS RISK CATEGORY Error: Q3, 4, or 5 should not be populated when Q2 is No Low Risk High Risk     Assessment and Plan:  SAIA DEROSSETT is a 28 y.o. year old female with a history of bipolar disorder, likely substance induced, marijuana use disorder, polysubstance use disorder, GERD, who presents for follow-up appointment for below.   1. Bipolar I disorder (HCC) # r/o rapid cycling # r/o PTSD History: OD ibuprofen at age 39, OD xanax at age 46 in the context of drug use, psych admission June 2025 for worsening in severe depression with psychotic symptoms with SI without intent. She reports manic episode with significant irritability, racing thoughts, increased goal-directed activity, excessive involvement in risky behaviors, which lasted for a week preceded to depressive  episode.  Although she continues to experience fatigue and depressive symptoms, there has been overall improvement since switching from Latuda  to Vraylar .  She agrees to try taking at night as there is some concern of possible adverse reaction of fatigue from this medication.  Noted that she also reports middle insomnia; will monitor her sleep in the meantime.  Will consider a lithium if she has limited benefit from this medication/or could not tolerate due to adverse reaction.  Will continue fluoxetine  to target bipolar depression, off label, and possible PTSD symptoms.  Discussed potential risk of medication-induced mania.  Will continue hydroxyzine  as needed for anxiety.   2. High risk medication use 3. Weight gain due to medication She has adverse reaction of increase in appetite since being on Vraylar .  Will start metformin for weight gain associated with antipsychotic use.       Last checked  EKG HR 59, QTc431msec 04/2024  Lipid panels TG 362H, LDL 78 05/2024  HbA1c 5 04/2024      5. Marijuana dependence (HCC) # binge alcohol use   She has been abstinent from both alcohol and marijuana since the previous visit.  Although she was recommended for pharmacological treatment, she is not interested in this at this time.  Will continue motivational interview.   Plan Change to take Vraylar  1.5 mg at night Start metformin 500 mg daily  Continue fluoxetine  10 mg daily  Continue hydroxyzine  50 mg daily as needed for anxiety Next appointment: 7/22 at 3:30, IP  Referred for evaluation of ADHD Referred for therapy     Past  trials of medication- Abilify , Quetiapine  (Zombie), Latuda  (less emotions), lamotrigine (rash),    The patient demonstrates the following risk factors for suicide: Chronic risk factors for suicide include: psychiatric disorder of depression, substance use disorder, and previous suicide attempts of overdosing medication . Acute risk factors for suicide include: family or marital  conflict. Protective factors for this patient include: responsibility to others (children, family) and hope for the future. Considering these factors, the overall suicide risk at this point appears to be moderate, but not at imminent risk. She denies gun access at home. Patient is appropriate for outpatient follow up. Emergency resources which includes 911, ED, suicide crisis line (988) are discussed.    Collaboration of Care: Collaboration of Care: Other reviewed notes in Epic  Patient/Guardian was advised Release of Information must be obtained prior to any record release in order to collaborate their care with an outside provider. Patient/Guardian was advised if they have not already done so to contact the registration department to sign all necessary forms in order for us  to release information regarding their care.   Consent: Patient/Guardian gives verbal consent for treatment and assignment of benefits for services provided during this visit. Patient/Guardian expressed understanding and agreed to proceed.    Katheren Sleet, MD 06/23/2024, 10:41 AM

## 2024-06-23 ENCOUNTER — Telehealth: Admitting: Psychiatry

## 2024-06-23 ENCOUNTER — Encounter: Payer: Self-pay | Admitting: Psychiatry

## 2024-06-23 DIAGNOSIS — R635 Abnormal weight gain: Secondary | ICD-10-CM

## 2024-06-23 DIAGNOSIS — F319 Bipolar disorder, unspecified: Secondary | ICD-10-CM | POA: Diagnosis not present

## 2024-06-23 DIAGNOSIS — Z79899 Other long term (current) drug therapy: Secondary | ICD-10-CM | POA: Diagnosis not present

## 2024-06-23 DIAGNOSIS — T50905A Adverse effect of unspecified drugs, medicaments and biological substances, initial encounter: Secondary | ICD-10-CM | POA: Diagnosis not present

## 2024-06-23 MED ORDER — METFORMIN HCL 500 MG PO TABS: 500.0000 mg | ORAL_TABLET | Freq: Every day | ORAL | 0 refills | Status: AC

## 2024-06-23 NOTE — Patient Instructions (Signed)
 Change to take Vraylar  1.5 mg at night Start metformin 500 mg daily  Continue fluoxetine  10 mg daily  Continue hydroxyzine  50 mg daily as needed for anxiety Next appointment: 7/22 at 3:30

## 2024-06-25 ENCOUNTER — Other Ambulatory Visit: Payer: Self-pay | Admitting: Psychiatry

## 2024-07-09 NOTE — Progress Notes (Unsigned)
 Sent a video visit link through Epic, but the patient didn't sign in. Tried calling for today's appointment, but got no answer. Left a voicemail instructing the patient to contact the office at [phone].

## 2024-07-12 ENCOUNTER — Ambulatory Visit: Admitting: Psychiatry

## 2024-07-13 ENCOUNTER — Telehealth (INDEPENDENT_AMBULATORY_CARE_PROVIDER_SITE_OTHER): Admitting: Psychiatry

## 2024-07-13 DIAGNOSIS — Z91199 Patient's noncompliance with other medical treatment and regimen due to unspecified reason: Secondary | ICD-10-CM

## 2024-08-02 ENCOUNTER — Ambulatory Visit: Admitting: Professional Counselor

## 2024-08-04 DIAGNOSIS — F32A Depression, unspecified: Secondary | ICD-10-CM | POA: Diagnosis not present

## 2024-08-04 DIAGNOSIS — E559 Vitamin D deficiency, unspecified: Secondary | ICD-10-CM | POA: Diagnosis not present

## 2024-08-04 DIAGNOSIS — F319 Bipolar disorder, unspecified: Secondary | ICD-10-CM | POA: Diagnosis not present

## 2024-08-04 DIAGNOSIS — F419 Anxiety disorder, unspecified: Secondary | ICD-10-CM | POA: Diagnosis not present

## 2024-08-04 DIAGNOSIS — Z124 Encounter for screening for malignant neoplasm of cervix: Secondary | ICD-10-CM | POA: Diagnosis not present

## 2024-08-04 DIAGNOSIS — Z0000 Encounter for general adult medical examination without abnormal findings: Secondary | ICD-10-CM | POA: Diagnosis not present

## 2024-08-17 ENCOUNTER — Ambulatory Visit (INDEPENDENT_AMBULATORY_CARE_PROVIDER_SITE_OTHER): Admitting: Professional Counselor

## 2024-08-17 DIAGNOSIS — F319 Bipolar disorder, unspecified: Secondary | ICD-10-CM
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
## 2024-08-22 ENCOUNTER — Other Ambulatory Visit: Payer: Self-pay | Admitting: Psychiatry

## 2024-08-22 NOTE — Telephone Encounter (Signed)
 Please contact the office for follow up for 30 mins if interested. Virtual is fine, although in person is preferred.

## 2024-09-06 DIAGNOSIS — F319 Bipolar disorder, unspecified: Secondary | ICD-10-CM | POA: Diagnosis not present

## 2024-09-06 DIAGNOSIS — F32A Depression, unspecified: Secondary | ICD-10-CM | POA: Diagnosis not present

## 2024-09-06 NOTE — Progress Notes (Signed)
 Chief Complaint  Patient presents with  . Follow-up    Holly Farrell is a 28 y.o. female who is calling in for a Telehealth visit. A face-to-face video encounter was done using [facility] My[facility].  This video encounter was conducted with the patient's (or proxy's) verbal consent via secure, interactive audio and video telecommunications while in clinic/office/hospital.  The patient (or proxy) was instructed to have this encounter in a suitably private space and to only have persons present to whom they give permission to participate. In addition, patient identity was confirmed by use of name plus an additional identifier.  This visit was coded based on medical decision making (MDM).  ---------------------------------------------  1. GAD/MDD/Bipolar: She has a history of mental health issues and has been followed by psychiatry in the past. She does have a history of substance abuse (marijuana, benzodiazepines) at age 74 and was diagnosed with bipolar disorder at that time and had been on medication since then, but stopped taking medication around 06/2024 because she didn't want to be on a mood stabilizer through psychiatry. She has tried several antipsychotics and other medications (ie. Abilify , Vraylar , Prozac , Hydroxyzine , Latuda , Trazodone , Lexapro) but notes she has felt worse on these medications. She has been on Lexparo only in the past and felt very well controlled and tolerated this well. She wanted to restart only Lexapro at our last visit, as she felt that she was misdiagnosed as bipolar and was experiencing more anxiety and depression with school starting. We discussed risks associated with medication, including inducing manic symptoms without a mood stabilizer. Her mom is her power of attorney and also wanted her to start on Lexapro for symptomatic management. She has been experiencing vivid dreams and doesn't feel well-rested, but does note that her anxiety and depression are much  improved. She denies any manic symptoms. She has not tried any OTC medications for sleep. She does feel that her dreams have improved, but she still doesn't feel like she is getting restful sleep.   Patient Active Problem List  Diagnosis  . GERD (gastroesophageal reflux disease)  . Acne  . Eczema, unspecified  . Anxiety  . Marijuana use  . Bipolar disorder, unspecified (CMS/HHS-HCC)  . History of head injury  . Tobacco use disorder  . Cannabis use disorder, moderate, dependence (CMS/HHS-HCC)  . Mild episode of recurrent major depressive disorder ()    Past Medical History:  Diagnosis Date  . Acne   . Anxiety   . Bipolar disorder (CMS/HHS-HCC)   . Depression   . Eczema   . GERD (gastroesophageal reflux disease)   . Overdose of benzodiazepine 06/24/2016   per pt report-Xanax  . Substance abuse (CMS/HHS-HCC)    Cocaine(states used once) and marjuiana    Past Surgical History:  Procedure Laterality Date  . EXTRACTION TEETH  01/2021   wisdom teeth  . DILATION AND CURETTAGE, DIAGNOSTIC / THERAPEUTIC  01/20/2024    Family History  Problem Relation Name Age of Onset  . Anxiety Mother    . Bipolar disorder Mother    . Depression Mother    . Obesity Mother    . Hyperlipidemia (Elevated cholesterol) Mother    . Rheum arthritis Mother    . Alcohol abuse Father    . OCD Father    . Asthma Father    . ADD / ADHD Daughter Blazelynn   . Coronary Artery Disease (Blocked arteries around heart) Maternal Grandmother    . Heart disease Maternal Grandmother    . Hyperlipidemia (  Elevated cholesterol) Maternal Grandmother    . Osteoarthritis Maternal Grandmother    . Osteoporosis (Thinning of bones) Maternal Grandmother    . Rheum arthritis Maternal Grandmother    . Tongue cancer Maternal Grandmother    . Dementia Maternal Grandmother    . Anxiety Maternal Grandfather    . Bipolar disorder Maternal Grandfather    . Suicidality Maternal Grandfather    . Schizophrenia Maternal  Grandfather    . High blood pressure (Hypertension) Paternal Grandmother    . Cervical cancer Paternal Grandmother    . Fibromyalgia Paternal Grandmother    . Ovarian cancer Paternal Grandmother    . Coronary Artery Disease (Blocked arteries around heart) Paternal Grandfather    . Hyperlipidemia (Elevated cholesterol) Paternal Grandfather    . Stroke Paternal Grandfather      Social History   Socioeconomic History  . Marital status: Single  . Number of children: 1  Tobacco Use  . Smoking status: Never    Passive exposure: Never  . Smokeless tobacco: Never  Vaping Use  . Vaping status: Every Day  Substance and Sexual Activity  . Alcohol use: Yes    Alcohol/week: 0.0 - 2.0 standard drinks of alcohol  . Drug use: Not Currently    Comment: quit smoking marijuana  . Sexual activity: Not Currently    Partners: Male    Birth control/protection: I.U.D.    Comment: copper IUD 01/20/2024   Social Drivers of Health   Financial Resource Strain: Low Risk  (08/17/2024)   Received from [facility]   Overall Financial Resource Strain (CARDIA)   . How hard is it for you to pay for the very basics like food, housing, medical care, and heating?: Not very hard  Food Insecurity: No Food Insecurity (08/17/2024)   Received from [facility]   Hunger Vital Sign   . Within the past 12 months, you worried that your food would run out before you got the money to buy more.: Never true   . Within the past 12 months, the food you bought just didn't last and you didn't have money to get more.: Never true  Transportation Needs: No Transportation Needs (08/17/2024)   Received from [facility] - Transportation   . In the past 12 months, has lack of transportation kept you from medical appointments or from getting medications?: No   . In the past 12 months, has lack of transportation kept you from meetings, work, or from getting things needed for daily living?: No  Physical Activity: Insufficiently  Active (08/17/2024)   Received from [facility]   Exercise Vital Sign   . On average, how many days per week do you engage in moderate to strenuous exercise (like a brisk walk)?: 1 day   . On average, how many minutes do you engage in exercise at this level?: 30 min  Stress: Stress Concern Present (08/17/2024)   Received from [facility] - Occupational Stress Questionnaire   . Do you feel stress - tense, restless, nervous, or anxious, or unable to sleep at night because your mind is troubled all the time - these days?: To some extent  Social Connections: Moderately Integrated (08/17/2024)   Received from [facility]   Social Connection and Isolation Panel   . In a typical week, how many times do you talk on the phone with family, friends, or neighbors?: Twice a week   . How often do you get together  with friends or relatives?: Once a week   . How often do you attend church or religious services?: 1 to 4 times per year   . Do you belong to any clubs or organizations such as church groups, unions, fraternal or athletic groups, or school groups?: Yes   . How often do you attend meetings of the clubs or organizations you belong to?: More than 4 times per year   . Are you married, widowed, divorced, separated, never married, or living with a partner?: Never married  Housing Stability: Patient Declined (08/04/2024)   Housing Stability Vital Sign   . Unable to Pay for Housing in the Last Year: Patient declined   . Number of Times Moved in the Last Year: 1   . Homeless in the Last Year: Patient declined    Current Outpatient Medications  Medication Sig Dispense Refill  . copper intrauterine contraceptive (PARAGARD T) (LARC) IUD Insert 1 each into the uterus once    . escitalopram oxalate (LEXAPRO) 10 MG tablet Take 1 tablet (10 mg total) by mouth once daily 90 tablet 3   No current facility-administered medications for this visit.    Allergies   Allergen Reactions  . Hydrocodone Hives  . Latex Swelling    Swelling of lips and rash  . Tramadol Rash    Results for orders placed or performed in visit on 08/04/24  IGP, rfx Aptima HPV ASCU - [facility]  Result Value Ref Range   Diagnostic Interpretation Comment    Specimen adequacy: - [facility] Comment    Clinician provided ICD10: - [facility] Comment    PERFORMED BY: - [facility] Comment    . - [facility] .    Note: - [facility] Comment    Test Method MT21 - [facility] Comment    . - [facility] Comment    Narrative   Performed at:  [address] [place], [place]  [id number] Lab Director: Frankey Sas MD, Phone:  [phone] Performed at:  [address] [facility] [address], [place], [place]  [id number] Lab Director: Frankey Sas MD, Phone:  [phone] Specimen Comment: No. of containers.SABRA01 ThinPrep Vial    BP Readings from Last 3 Encounters:  08/04/24 112/78  08/04/23 126/74  10/01/22 124/72    Wt Readings from Last 3 Encounters:  08/04/24 79.8 kg (176 lb)  08/04/23 75.8 kg (167 lb)  10/01/22 71.8 kg (158 lb 3.2 oz)      Review of Systems (as otherwise noted in HPI):   Negative for: fever, chills, nausea, or vomiting.  Virtual Physical Exam:   General: Patient is well-groomed, well-nourished, appears stated age, in no acute distress.  Respiratory: Patient is able to speak in complete sentences without obvious shortness of breath. No acute distress, accessory muscle use, or tachypnea.   Psych: Patient is alert and oriented, normal affect.    Assessment/Plan:   1. Anxiety and depression 2. Bipolar 1 disorder (CMS/HHS-HCC) Improved. She is taking Lexapro 10 mg daily and tolerates this well. She was experiencing vivid dreams with initiation, but notes this has started to taper off. She denies any mania symptoms with SSRI initiation. Discussed adding medication for sleep, but she has tried trazodone  and hydroxyzine  in the past with adverse  effects. Discussed adding mirtazapine , but she would like to hold off for now. Refills sent to pharmacy. -     escitalopram oxalate (LEXAPRO) 10 MG tablet; Take 1 tablet (10 mg total) by mouth once daily     Both  patient and care provider will, using this treatment method, endeavor to provide care and treatment that cannot be done in office. The patient has been advised of the potential risks and limitations of this mode of treatment (including, but not limited to, the absence of in-person examination) and has agreed to be treated in a remote fashion in spite of them.  The patient has also been advised to contact this office for worsening conditions or problems, and seek medical treatment and/or call 911 if the patient deems either necessary.    Edsel Pepper, PA-C

## 2024-09-09 ENCOUNTER — Ambulatory Visit (INDEPENDENT_AMBULATORY_CARE_PROVIDER_SITE_OTHER): Admitting: Professional Counselor

## 2024-09-09 ENCOUNTER — Telehealth: Payer: Self-pay | Admitting: Psychiatry

## 2024-09-09 DIAGNOSIS — F319 Bipolar disorder, unspecified: Secondary | ICD-10-CM | POA: Diagnosis not present

## 2024-09-09 NOTE — Progress Notes (Unsigned)
  THERAPIST PROGRESS NOTE  Session Time: 8:04 AM - 8:47 AM   Participation Level: Active  Behavioral Response: Casual, Alert, Anxious  Type of Therapy: Individual Therapy  Treatment Goals addressed: Active Anxiety  LTG: I guess just an overall change in my habits, noticing better coping skills and that sort of thing.     Start:  09/09/24    Expected End:  09/08/25     STG: Coping skills to manage anxiety and depression. To improve anxiety/depression sxs AEB increased use of coping skills and reduced GAD7/PHQ9 scores over the next 12 weeks.    STG: Better self-care. Just taking care of myself better, like face masks, doing my nails, going to the gym, just eating better. To improve self-care AEB creating a balanced routine over the next 12 weeks.    ProgressTowards Goals: Initial  Interventions: {CHL AMB BH Type of Intervention:21022753}  Summary: Holly Farrell is a 28 y.o. female who presents with ***.   Suicidal/Homicidal: {BHH YES OR NO:22294}{yes/no/with/without intent/plan:22693}  Therapist Response: ***  Plan: Return again in *** weeks.  Diagnosis: No diagnosis found.  Collaboration of Care: {BH OP Collaboration of Care:21014065}  Patient/Guardian was advised Release of Information must be obtained prior to any record release in order to collaborate their care with an outside provider. Patient/Guardian was advised if they have not already done so to contact the registration department to sign all necessary forms in order for us  to release information regarding their care.   Consent: Patient/Guardian gives verbal consent for treatment and assignment of benefits for services provided during this visit. Patient/Guardian expressed understanding and agreed to proceed.   [name], [facility] 09/09/2024

## 2024-09-09 NOTE — Telephone Encounter (Signed)
 Patient never returned calls to schedule her follow up she did not show for. She came in for her therapy appointment and was asked to reschedule. Patient states she does not want to reschedule and she has found another provider for her care.

## 2024-09-14 ENCOUNTER — Encounter: Payer: Self-pay | Admitting: Psychology

## 2024-09-14 ENCOUNTER — Encounter: Attending: Psychology | Admitting: Psychology

## 2024-09-14 DIAGNOSIS — R41840 Attention and concentration deficit: Secondary | ICD-10-CM | POA: Diagnosis not present

## 2024-09-14 DIAGNOSIS — F314 Bipolar disorder, current episode depressed, severe, without psychotic features: Secondary | ICD-10-CM | POA: Insufficient documentation
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
## 2024-09-16 ENCOUNTER — Ambulatory Visit: Admitting: Professional Counselor

## 2024-09-16 DIAGNOSIS — F411 Generalized anxiety disorder: Secondary | ICD-10-CM | POA: Diagnosis not present

## 2024-09-16 DIAGNOSIS — F319 Bipolar disorder, unspecified: Secondary | ICD-10-CM

## 2024-09-16 NOTE — Progress Notes (Signed)
  THERAPIST PROGRESS NOTE  Session Time: 8:02 AM - 8:55 AM  Participation Level: Active  Behavioral Response: Casual, Alert, Anxious and Depressed  Type of Therapy: Individual Therapy  Treatment Goals addressed:  Active Anxiety  LTG: I guess just an overall change in my habits, noticing better coping skills and that sort of thing.                 Start:  09/09/24    Expected End:  09/08/25      STG: Coping skills to manage anxiety and depression. To improve anxiety/depression sxs AEB increased use of coping skills and reduced GAD7/PHQ9 scores over the next 12 weeks.     STG: Better self-care. Just taking care of myself better, like face masks, doing my nails, going to the gym, just eating better. To improve self-care AEB creating a balanced routine over the next 12 weeks.      ProgressTowards Goals: Progressing  Interventions: CBT, Motivational Interviewing, Solution Focused, and Supportive  Summary: Holly Farrell is a 28 y.o. female who presents with a history of bipolar disorder. She appeared anxious but oriented x5. She reported she has been stressed lately. Holly Farrell discussed upcoming events she is worried about - her brother's wedding and school. She reported she had her first appointment to be evaluated for ADHD. She was receptive to resources for assistance and took copies of handouts for tips on managing symptoms. Holly Farrell engaged in writing exercise to identify things within and outside of her control. She was in agreement to discuss assertiveness skills next session.   Therapist Response: Conducted session with Holly Farrell. Began session with check-in/update since previous session. Utilized empathetic and reflective listening. Used open-ended questions to facilitate discussion and summarized Holly Farrell's thoughts/feelings. Provided [email or web address] as resource for ADHD and printed handouts with tips on managing symptoms. Engaged Holly Farrell in writing exercise - donut/circles of control. Assisted  with identifying things within and outside of her control. Identified plan for next session. Scheduled additional appointment and concluded session.   Suicidal/Homicidal: No  Plan: Return again in 1 week.  Diagnosis: Bipolar I disorder (HCC)  Anxiety state  Collaboration of Care: Medication Management AEB chart review  Patient/Guardian was advised Release of Information must be obtained prior to any record release in order to collaborate their care with an outside provider. Patient/Guardian was advised if they have not already done so to contact the registration department to sign all necessary forms in order for us  to release information regarding their care.   Consent: Patient/Guardian gives verbal consent for treatment and assignment of benefits for services provided during this visit. Patient/Guardian expressed understanding and agreed to proceed.   Holly Farrell, [facility] 09/16/2024

## 2024-09-23 ENCOUNTER — Ambulatory Visit (INDEPENDENT_AMBULATORY_CARE_PROVIDER_SITE_OTHER): Admitting: Professional Counselor

## 2024-09-23 DIAGNOSIS — F319 Bipolar disorder, unspecified: Secondary | ICD-10-CM

## 2024-09-23 NOTE — Progress Notes (Signed)
  THERAPIST PROGRESS NOTE  Session Time: 8:05 AM - 8:55 AM  Participation Level: Active  Behavioral Response: Casual, Alert, Euthymic  Type of Therapy: Individual Therapy  Treatment Goals addressed: Active Anxiety  LTG: I guess just an overall change in my habits, noticing better coping skills and that sort of thing.                 Start:  09/09/24    Expected End:  09/08/25      STG: Coping skills to manage anxiety and depression. To improve anxiety/depression sxs AEB increased use of coping skills and reduced GAD7/PHQ9 scores over the next 12 weeks.     STG: Better self-care. Just taking care of myself better, like face masks, doing my nails, going to the gym, just eating better. To improve self-care AEB creating a balanced routine over the next 12 weeks.    ProgressTowards Goals: Progressing  Interventions: CBT, Motivational Interviewing, and Supportive  Summary: [name] is a 28 y.o. female who presents with bipolar disorder. She appeared alert and oriented x5. She stated they are leaving for [place]  today for her brother's wedding. She is feeling better about things but reported it's still going to be a long and chaotic day. She actively listened to [facility] MAN skill and practiced in session. She was receptive to ways she is already being assertive and setting boundaries. She noted this will be helpful, particularly with her mother.   Therapist Response: Conducted session with [place]. Began session with check-in/update since previous session. Utilized empathetic and reflective listening. Used open-ended questions to facilitate discussion and summarized Kobie's thoughts/feelings. Explained and modeled DEAR MAN skill. Offered time to practice skill and provided feedback. Scheduled additional appointment and concluded session.   Suicidal/Homicidal: No  Plan: Return again in 2 weeks.  Diagnosis: Bipolar I disorder (HCC)  Collaboration of Care: Medication Management  AEB chart review  Patient/Guardian was advised Release of Information must be obtained prior to any record release in order to collaborate their care with an outside provider. Patient/Guardian was advised if they have not already done so to contact the registration department to sign all necessary forms in order for us  to release information regarding their care.   Consent: Patient/Guardian gives verbal consent for treatment and assignment of benefits for services provided during this visit. Patient/Guardian expressed understanding and agreed to proceed.   [name], [facility] 09/23/2024

## 2024-09-30 ENCOUNTER — Encounter

## 2024-10-07 ENCOUNTER — Ambulatory Visit: Admitting: Professional Counselor

## 2024-10-07 DIAGNOSIS — F319 Bipolar disorder, unspecified: Secondary | ICD-10-CM

## 2024-10-07 NOTE — Progress Notes (Signed)
  THERAPIST PROGRESS NOTE  Session Time: 8:10 AM - 8:55 AM  Participation Level: Active  Behavioral Response: Casual, Alert, Dysphoric  Type of Therapy: Individual Therapy  Treatment Goals addressed:  Active Anxiety  LTG: I guess just an overall change in my habits, noticing better coping skills and that sort of thing.                 Start:  09/09/24    Expected End:  09/08/25      STG: Coping skills to manage anxiety and depression. To improve anxiety/depression sxs AEB increased use of coping skills and reduced GAD7/PHQ9 scores over the next 12 weeks.     STG: Better self-care. Just taking care of myself better, like face masks, doing my nails, going to the gym, just eating better. To improve self-care AEB creating a balanced routine over the next 12 weeks.    ProgressTowards Goals: Progressing  Interventions: CBT, Motivational Interviewing, Assertiveness Training, and Supportive  Summary: Holly Farrell is a 28 y.o. female who presents with bipolar disorder. She appeared alert and oriented x5. She reported her brother's wedding was horrible. She shared her thoughts/feelings about what happened. Holly Farrell explored ways to move forward and pros/cons of addressing her concerns with her brother and/or father. She was receptive to using DEAR MAN skill to help guide a conversation.   Therapist Response: Conducted session with Holly Farrell. Began session with check-in/update since previous session. Utilized empathetic and reflective listening. Used open-ended questions to facilitate discussion and summarized Holly Farrell's thoughts/feelings. Explored pros/cons of various responses and approaching conversations with her brother and/or father. Reminded Holly Farrell of DEAR MAN skill to assist with having difficult conversations. Scheduled additional appointment and concluded session.   Suicidal/Homicidal: No  Plan: Return again in 2 weeks.  Diagnosis: Bipolar I disorder (HCC)  Collaboration of Care:  Medication Management AEB chart review  Patient/Guardian was advised Release of Information must be obtained prior to any record release in order to collaborate their care with an outside provider. Patient/Guardian was advised if they have not already done so to contact the registration department to sign all necessary forms in order for us  to release information regarding their care.   Consent: Patient/Guardian gives verbal consent for treatment and assignment of benefits for services provided during this visit. Patient/Guardian expressed understanding and agreed to proceed.   Holly Farrell, [facility] 10/07/2024

## 2024-10-14 ENCOUNTER — Encounter

## 2024-10-21 ENCOUNTER — Ambulatory Visit (INDEPENDENT_AMBULATORY_CARE_PROVIDER_SITE_OTHER): Admitting: Professional Counselor

## 2024-10-21 DIAGNOSIS — F319 Bipolar disorder, unspecified: Secondary | ICD-10-CM

## 2024-10-21 NOTE — Progress Notes (Signed)
  THERAPIST PROGRESS NOTE  Session Time: 8:15 AM - 9:03 AM   Participation Level: Active  Behavioral Response: Well Groomed, Alert, Dysphoric  Type of Therapy: Individual Therapy  Treatment Goals addressed: Active Anxiety  LTG: I guess just an overall change in my habits, noticing better coping skills and that sort of thing.                 Start:  09/09/24    Expected End:  09/08/25      STG: Coping skills to manage anxiety and depression. To improve anxiety/depression sxs AEB increased use of coping skills and reduced GAD7/PHQ9 scores over the next 12 weeks.     STG: Better self-care. Just taking care of myself better, like face masks, doing my nails, going to the gym, just eating better. To improve self-care AEB creating a balanced routine over the next 12 weeks.    ProgressTowards Goals: Progressing  Interventions: CBT, Motivational Interviewing, Conservator, Museum/gallery, and Supportive  Summary: SHERIDAN HEW is a 28 y.o. female who presents with a  history of bipolar disorder. She appeared somber but oriented x5. She stated she didn't prepare what to say to her brother and now she's unsure if she wants to talk to him at all. She shared additional updates within the family about the wedding and expressed feeling more embarrassed by her actions. She engaged in discussion and explored ways to improve communication with her mother and family. Sequoya discussed concerns about her daughter's behavior. She took handouts on effective behavior change strategies.   Therapist Response: Conducted session with [place]. Began session with check-in/update since previous session. Utilized empathetic and reflective listening. Used open-ended questions to facilitate discussion and summarized Chong's thoughts/feelings. Explored contributing factors to Zyrah's struggle with communication/assertiveness and modeled ways to approach topics. Provided handouts for effective behavior change strategies.  Scheduled additional appointment and concluded session.   Suicidal/Homicidal: No  Plan: Return again in 2 weeks.  Diagnosis: Bipolar I disorder (HCC)  Collaboration of Care: Medication Management AEB chart review  Patient/Guardian was advised Release of Information must be obtained prior to any record release in order to collaborate their care with an outside provider. Patient/Guardian was advised if they have not already done so to contact the registration department to sign all necessary forms in order for us  to release information regarding their care.   Consent: Patient/Guardian gives verbal consent for treatment and assignment of benefits for services provided during this visit. Patient/Guardian expressed understanding and agreed to proceed.   [name], [facility] 10/21/2024

## 2024-10-27 ENCOUNTER — Encounter: Attending: Psychology

## 2024-10-27 DIAGNOSIS — F314 Bipolar disorder, current episode depressed, severe, without psychotic features: Secondary | ICD-10-CM | POA: Diagnosis not present

## 2024-10-27 DIAGNOSIS — R41840 Attention and concentration deficit: Secondary | ICD-10-CM | POA: Insufficient documentation

## 2024-10-27 NOTE — Progress Notes (Signed)
   Behavioral Observations:  The patient appeared well-groomed and appropriately dressed. Her manners were polite and appropriate to the situation. The patient would occasionally shift her gaze around the room during auditory trials. Overall, her attitude towards testing was positive and she demonstrated a good effort.  Neuropsychology Note  [name] completed 75 minutes of neuropsychological testing with technician, Josue Ned, BA, under the supervision of Norleen Asa, PsyD., Clinical Neuropsychologist. The patient did not appear overtly distressed by the testing session, per behavioral observation or via self-report to the technician. Rest breaks were offered.   Clinical Decision Making: In considering the patient's current level of functioning, level of presumed impairment, nature of symptoms, emotional and behavioral responses during clinical interview, level of literacy, and observed level of motivation/effort, a battery of tests was selected by Dr. Asa during initial consultation on 09/14/2024. This was communicated to the technician. Communication between the neuropsychologist and technician was ongoing throughout the testing session and changes were made as deemed necessary based on patient performance on testing, technician observations and additional pertinent factors such as those listed above.  Tests Administered: Comprehensive Attention Battery (CAB) Continuous Performance Test (CPT)   Results: Will be included in final report   Feedback to Patient: Holly Farrell will return on 01/20/2025 for an interactive feedback session with Dr. Asa at which time her test performances, clinical impressions and treatment recommendations will be reviewed in detail. The patient understands she can contact our office should she require our assistance before this time.  75 minutes spent face-to-face with patient administering standardized tests, 45 minutes spent  scoring radiographer, therapeutic). [CPT [id number], [id number]]  Full report to follow.

## 2024-11-08 ENCOUNTER — Ambulatory Visit: Admitting: Professional Counselor

## 2024-11-08 DIAGNOSIS — J069 Acute upper respiratory infection, unspecified: Secondary | ICD-10-CM | POA: Diagnosis not present

## 2024-11-08 DIAGNOSIS — R0602 Shortness of breath: Secondary | ICD-10-CM | POA: Diagnosis not present

## 2024-11-08 DIAGNOSIS — R070 Pain in throat: Secondary | ICD-10-CM | POA: Diagnosis not present

## 2024-11-11 ENCOUNTER — Ambulatory Visit: Admitting: Professional Counselor

## 2024-11-23 ENCOUNTER — Telehealth: Payer: Self-pay | Admitting: Professional Counselor

## 2024-11-23 ENCOUNTER — Ambulatory Visit: Admitting: Professional Counselor

## 2024-11-23 NOTE — Telephone Encounter (Signed)
 Attempted to contact pt about appointment. Received VM but was unable to leave message due to mailbox being full. Will send letter due to attendance.

## 2024-11-25 ENCOUNTER — Ambulatory Visit: Admitting: Professional Counselor

## 2025-01-13 ENCOUNTER — Encounter: Attending: Psychology | Admitting: Psychology

## 2025-01-13 ENCOUNTER — Encounter: Payer: Self-pay | Admitting: Psychology

## 2025-01-13 DIAGNOSIS — R41840 Attention and concentration deficit: Secondary | ICD-10-CM | POA: Insufficient documentation

## 2025-01-13 DIAGNOSIS — F314 Bipolar disorder, current episode depressed, severe, without psychotic features: Secondary | ICD-10-CM | POA: Insufficient documentation

## 2025-01-13 NOTE — Progress Notes (Signed)
 Neuropsychological Evaluation   Patient:  Holly Farrell   DOB: 24-Jun-1996  MR Number: [id number]  Location: [facility] [address] [place], [address] [place] [address] Dept: [phone]  Start: 8 AM End: 9 AM  Provider/Observer:     Norleen JONELLE Asa PsyD  Chief Complaint:      Chief Complaint  Patient presents with   Anxiety   Depression   Sleeping Problem   Other    Attentional deficits Previous episodes of manic symptoms     Reason For Service:      Holly Farrell is a 29 year old female referred by Katheren Sleet, MD, who is her treating psychiatrist for a neuropsychological evaluation. The referral is to obtain an objective review and assist with differential diagnosis, given a history of depressive and manic events, a working diagnosis of bipolar disorder, and concerns regarding attentional abilities. The evaluation aims to clarify a potential diagnosis of Adult Residual Attention Deficit Disorder versus attentional difficulties secondary to a mood disorder.   Past medical history includes GERD, bipolar disorder (diagnosed as either type I and type II previously), anxiety, and depression. There is a history of two suicide attempts via drug ingestion (ibuprofen at age 29, Xanax at age 63) in the context of substance use. 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 She has experienced manic episodes characterized by significant irritability, racing thoughts, increased goal-directed behavior, and excessive involvement in risky behaviors, lasting up to a week and often preceded by depressive episodes. She had a psychiatric admission in 2025 for worsening depression with psychotic symptoms and suicidal ideation without intent. She reports a history of marijuana use disorder and polysubstance use disorder but has been abstinent from alcohol and marijuana for some time.    Family history is significant for depression, anxiety disorder, and bipolar disorder. Her father has obsessive-compulsive disorder and a history of alcohol abuse. Her brother has a significant sleep disorder. Her maternal grandfather, who had diagnoses of schizophrenia and bipolar disorder and also abused alcohol, died by suicide.   Patient denies any history of tics or other movement disorders and denies a history of seizures or other neurological involvement.  Patient had little knowledge of specific events around her head injury at age 73 although did report that she was told it was related to her knocking a TV stand over with the TV landing on her and a period of unconsciousness and the patient was taken to the emergency department and she has been told she spent some time in the hospital after this injury.  The patient was 29 years old and has very vague memories of this occurring.   Onset and Duration of Symptoms:  Attentional difficulties were first noted by teachers in elementary school, around third grade. Teachers described hyperactivity and issues with paying attention. The patient began to review her history around these types of symptoms and  difficulties more significantly two to three years ago after her daughter was diagnosed with ADD and she recognized similarities in their symptoms. Symptoms of anxiety have been present since childhood, described as being an anxious child, a nail-biter, and a fidgeter. Symptoms of sadness and anxiety became more pronounced in middle school following her parents' divorce. A formal diagnosis of anxiety and depression was made at age 61.   Progression of Symptoms:  Attentional issues have persisted since childhood. Mood symptoms have included significant depressive episodes and  manic episodes. Self-harm behaviors began around age 20, following her parents' divorce. There are no current suicidal ideations.   Sleep:  Sleep is poor. Reports going to sleep  around 9:00 PM and waking between 1:00 AM and 3:00 AM. She will stay awake for a period, often using her phone (TikTok), before sleeping for another hour or so. The night prior to the appointment, she did not go to bed until 3:00 AM due to stress. The clinician advised against using her phone upon waking at night due to blue light exposure and the nature of the content, which can interfere with sleep.   Medical History:                         Past Medical History:  Diagnosis Date   Acne 01/28/2017   Acute drug intoxication with perceptual disturbance (HCC) 09/13/2014   Anxiety     Bipolar disorder (HCC)     Bipolar II disorder (HCC) 06/25/2016   Depression     Family history of ovarian cancer     GERD (gastroesophageal reflux disease) 01/28/2017   Hemorrhoid 01/16/2018   History of head injury 09/23/2014    Overview:  Patient had TV fall on her head at age 54   Mania (monopolar) single episode or unspecified 09/23/2014    Overview:  Unclear if substance induced or true Bipolar Type I   Sedative, hypnotic or anxiolytic use disorder, mild, 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  The patient participation and motivation for the evaluation are good. Attention and concentration appear variable. Remote and recent memory seem grossly intact. Speech is of normal volume and quality. Thought processes are coherent and relevant. Thought content is negative for current suicidal or homicidal ideation.  She is oriented to person, place, and situation. Judgment, planning, and insight appear fair. Intelligence is estimated to be in the average range. Mood is described as anxious and stressed; affect is congruent.    Marital Status/Living:             The patient is single and lives with her mother and seven-year-old daughter. Her parents divorced during her middle school years.   Educational and Occupational History:                           Highest Level of Education:  Graduated high school. She reports her GPA was not good due to inconsistent homework completion and poor attendance, despite performing well on tests.   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 She enjoyed the fast-paced and changing environment of the job.   Family Med/Psych History:        Family History  Problem Relation Age of Onset   Depression Mother     Anxiety disorder Mother     Bipolar disorder Mother     Hypertension Mother     Asthma Father     Alcohol abuse Father     Sleep disorder Brother     Hypertension Maternal Grandmother     Bipolar disorder Maternal Grandfather     Ovarian cancer Paternal Grandmother            Psychiatric History:  Diagnosed with anxiety and depression at age 39. First attended therapy around age 26-13 due to her parents' divorce. Began self-harming behaviors around age 89. History of two suicide attempts by overdose at ages 59 and 68. History of manic and depressive episodes consistent with a bipolar disorder diagnosis. History of psychiatric hospitalization for severe depression with psychotic features. Current medication includes fluoxetine  (Prozac ).   History of Substance Use or Abuse:  History of marijuana use disorder and polysubstance use  disorder. Currently reports abstinence from alcohol and marijuana.  Tests Administered: Comprehensive Attention Battery (CAB) Continuous Performance Test (CPT) [place]  Multiphasic Personality Inventory-II  Participation Level:   Active  Participation Quality:  Appropriate      Behavioral Observation:  The patient appeared well-groomed and appropriately dressed. Her manners were polite and appropriate to the situation. The patient would occasionally shift her gaze around the room during auditory trials. Overall, her attitude towards testing was positive and she demonstrated a good effort.   Well Groomed, Alert, and Appropriate.   Test Results:   Initially, consideration as to validity of the current objective neuropsychological and psychological test data suggest that this does appear to be a fair and valid assessment of the patient's current status.  Validity checks embedded within the comprehensive attention battery were all within normative expectations.  For example, the patient showed relatively slowed but good accuracy scores on both auditory and visual pure reaction time measures and as the patient became increasingly comfortable with the correct screen user interface or performances on both clinical and validity types of cognitive challenges were all within normative expectations.  On the MMPI-2 the patient also produced a valid profile although she did acknowledge significant elevations in indications of numerous clinically relevant symptoms.  Her descriptions and acknowledgment's of clinical features are consistent with the patient's prior psychiatric history and therefore also indicate validity within the MMPI-2.Overall, this does not appear to be a fair and accurate representation of the patient's current status.  The patient was administered the comprehensive attention battery in its entirety to a provide an objective assessment of a wide range of attention and executive functioning  components and both auditory and visual sensory modalities.  On the pure auditory and visual reaction time measures the patient accurately responded to 50 of 50 targets on each measure and had an average reaction time/response time of 789 ms on the pure visual reaction time and 772 ms on the  pure auditory.  This is somewhat slow relative to normative expectations although review of response times on later measures falling within normative expectations would suggest that this does not indicate efforts to demonstrate a pattern of slowed information processing speed but was more reflective of becoming comfortable with the testing situation.  The patient was then administered the discriminate reaction time measures.  On the visual discriminate reaction time measure the patient correctly responded to 34 of 35 targets with no errors of commission and only 1 error of omission.  Her average response time was 774 ms which again is only slightly below normative expectations as far as response times.  On the auditory discriminate reaction time measure the patient also showed good accuracy scores correctly responded to 33 of 35 targets with 1 error of commission and 1 error of omission.  The patient again had some mild slowing as far as reaction/response time averaging 913 ms.  On the shift discriminate reaction time measure the patient correctly responded to 29 of 30 targets with no errors of commission and only 1 error of omission.  Her average response time was 1066 ms which is just over 1 standard deviations slower than normative comparison points.  The patient was then administered the auditory/visual scan reaction time measures.  The patient again showed very good accuracy scores.  The patient correctly responded to 40 of 40 targets on the visual scan reaction time measure averaging 768 ms which is within normative expectations.  The patient correctly responded to 39 of 40 targets with 1 error of commission on the  visual scan reaction time measure and average 1266 ms which is right at normative expectations.  On the mixed scan reaction time measure she correctly responded to 37 of 40 targets with 3 errors of omission and an average response time of 1220 ms.  All of these are within normal expectations.  All the auditory/visual encoding measures the patient performed quite well for both auditory and visual encoding capacity performing right at normative averages for her age, education and gender matched comparison group.  The patient showed no significant difficulties for either auditory or visual encoding forwards or backwards suggesting the capacity to effectively store information in her auditory and visual registers as well as the ability to process that information during immediate presentation.  The patient was then administered the Stroop interference cancellation test.  The patient did display some greater difficulty with this particular measure relative to all other measures on the battery.  This measure requires active visual scanning and visual searching and quickly shifts from 1 trial to the other.  Review overall data suggest that the patient initially did quite well with the initial noninterference trial correctly responding to 14 of 18 targets within the 15 seconds allotted.  This is roughly 1 standard deviation better than her normative expectations.  However, when this measure quickly shifted to the next when she had difficulty adjusting to that but then regained her performance later.  The patient also had difficulty shifting from the 9 interference trials to the interference trials initially going from 10 out of 18 targets and 15 seconds on the third 9 interference series to 3 out of 18 target responses and the first targeted interference.  However, as this proceeded perform for months even under the targeted interference began to reflects her overall performance on noninterference trials.  This pattern  does suggest difficulty shifting when there is considerable distraction but other than that her overall performance was within normative  expectations.  Finally, the patient was administered the visual monitor CPT measure from the comprehensive attention battery.  This is a 15-minute continual discriminate reaction time measure that is broken down into five 3-minute blocks of time for analysis.  On the first 3 minutes the patient correctly responded to 27 of 30 targets with 3 errors of omission and an average response time of 637 ms.  These are generally within normative expectations.  However, the patient continued to show persistent number of errors of omission that are stable throughout the testing measures.  The patient's average response time continues to deteriorate.  On the last 3 minutes of this measure the patient correctly responded to 25 of 30 targets with 5 errors of omission and an average response time of 865 ms.  This is more than 200 ms slower than her initial time during the first 3 minutes of this task and an elevation in numbers of errors of omission would suggest difficulties sustaining attention and concentration as a function of time.  Overall, objective measures of attention and concentration looking at multiple variables or factors of attention demonstrate that the patient has some slowing of information processing speed in general but particular difficulties with shifting of attention and sustaining of attention.  There are clear indications of attentional weaknesses and deficits on these measures primarily around sustaining attention and shifting attention.  The patient does well on focus execute components of attention and also did very well on both auditory and visual encoding measures as well as her capacity to actively process information in her auditory and visual registers.  Next, the patient was administered the [place]  multiphasic personality inventory.  As far as validity of  this assessment, the patient did appear to approach this measure in an honest and straightforward manner neither attempting to magnify or exaggerate any symptoms that she is experience or minimize particular clinically relevant features.  The patient does have an elevation of clinically relevant features but these are also consistent with her psychiatric history.  The resulting primary clinical scales indicate significant elevations relative to normative populations on measures of specific health concerns and physical/somatic complaints as well as significant elevations in aspects of stress and anxiety and difficulty modulating mood states.  The patient has a number of issues consistent with underlying anger/frustration and difficulty coping with particular day-to-day life stressors and perceived negative interactions from others.  The patient's most significant clinical feature had to do with clear indications of difficulties managing and regulating her thoughts and feelings, significant anxiety and tension and difficulty regulating her cognitive capacities.  Further analysis utilizing content and supplemental scales highlight this underlying anxiety, symptoms of depression related to sadness and physical difficulties rather than physical or psychomotor retardation are noted.  The patient acknowledges difficulties with cognitive efficiency and mental dullness and describes malaise and fatigue as a primary function.  The patient has significant elevations of semantic/health concerns noted.  The patient acknowledges a longstanding history of conflicts within her family and specific relationships both within her family as well as work type relationships.  The patient has significant elevations in elements of anger and frustration towards others.  Of note, the patient does not show significant elevations on the Mc[place]s alcohol scale which has been shown to have some capacity to predict vulnerability for substance  abuse and in particular alcohol abuse.  The patient has significant elevations on both of the posttraumatic stress disorder scales which is likely some of the underlying features driving the significant underlying  and embedded anger that she tends to exerted significant amount of cognitive and emotional effort to suppress.  The patient acknowledges a number of manic or hypomanic episodes in her life but much of this is subsumed under her symptoms consistent with significant posttraumatic stress disorder and residual anxiety and dissipation of vulnerability towards future harm.  [place] of Assessment Data:    Attention and Processing Speed  Across the Comprehensive Attention Battery (CAB), the patient demonstrated: Accurate responding on simple reaction time tasks, with mildly slowed response speed. This likely reflects initial adjustment to the testing interface rather than a fundamental processing-speed deficit. Good performance on visual and auditory discriminate reaction time tasks, again with some mild response slowing. Normal encoding for both auditory and visual modalities, forward and backward--suggesting intact working memory capacity and information registration.  However, two key weakness patterns emerged:  1. Difficulty with Attention Shifting (Cognitive Flexibility) The Stroop Interference/Cancellation Test showed: Strong initial performance on non-interference trials (above average). Notable decline during rapid transitions between trial types. Ability to recover performance once the new conditions stabilized.  This pattern indicates reduced cognitive flexibility and difficulty adjusting under conditions of increased distraction or rapidly changing task demands.  2. Difficulty Sustaining Attention Over Time On the Continuous Performance Test (CPT): Accuracy and response times were initially within normal limits. Over the 15-minute task, response times slowed by more than 200 ms,  and errors of omission increased, reflecting lapses in vigilance.  This pattern is consistent with reduced sustained attention and diminished capacity to maintain consistent cognitive effort over longer intervals.  Summary of Cognitive Findings Strengths: Focus/execute attention, encoding/working memory capacity, accuracy on structured tasks. Weaknesses: Sustained attention and attentional shifting, with mild generalized slowing under more demanding conditions. Interpretation: Objective evidence supports attentional vulnerabilities, but these do not reflect developmental ADHD alone; rather, they occur in the context of mood instability, anxiety, trauma-related symptoms, poor sleep, and cognitive fatigue.  Personality and Psychopathology Assessment (MMPI-2)  The MMPI-2 profile was valid and consistent with the patient's psychiatric history. The pattern revealed: Significant anxiety, tension, and emotional dysregulation Prominent somatic concerns Difficulties modulating mood states, with elevations consistent with bipolar-spectrum features High levels of stress and rumination Strong indicators of posttraumatic stress, including intrusive symptoms and chronic hyperarousal Elevated anger/frustration and chronic interpersonal conflict, often internalized or suppressed Acknowledgment of past hypomanic symptoms, though much of her psychological burden reflects trauma-related hyperactivation and chronic worry rather than primary manic drive  Her profile suggests that PTSD symptoms, chronic anxiety, and mood instability are major contributors to her cognitive inefficiency and attentional fluctuations.  Impression/Diagnosis:   Lakeyia E. Mansouri has a complex psychiatric history including bipolar disorder, anxiety, depression, and I have concern for possible significant past trauma, as well as a childhood traumatic brain injury (age 72) and multiple psychosocial stressors across development. She has  experienced manic/hypomanic episodes characterized by irritability, racing thoughts, impulsivity, and increased goal-directed behavior, as well as significant major depressive episodes, including one requiring psychiatric hospitalization in 2025 with psychotic features.  She reports a history of two suicide attempts in adolescence and young adulthood and past polysubstance use, though she currently maintains abstinence from alcohol and marijuana. Family history is notable for bipolar disorder, schizophrenia, anxiety disorders, substance misuse, and suicide, placing her at elevated genetic vulnerability.  Attentional difficulties were first noted in elementary school, and the patient has wondered about an ADHD diagnosis after her daughter was diagnosed with ADHD. She describes chronic inattention, distractibility, and difficulty sustaining effort. Sleep patterns are significantly  disrupted and characterized by frequent nocturnal awakenings, inconsistent sleep timing, and nighttime phone use, which likely contributes to cognitive fatigue.  The cognitive profile and clinical history together suggest:  Evidence of attentional impairment--especially in the domains of sustained attention and cognitive flexibility. These deficits appear multifactorially determined, with contributions from: Bipolar disorder (current mood instability and past affective episodes) Significant anxiety PTSD-related hyperarousal and cognitive intrusions Fragmented and insufficient sleep Chronic stress and emotional dysregulation Although attentional difficulties date back to childhood, the pattern identified is not entirely typical for classic ADHD, which often involves more substantial deficits in working memory, encoding, and response inhibition.  Also, the patient reports history of TBI around age 22, which complicates attribution of causative factors significantly.  Taken together, the findings support a formulation of  attentional dysfunction secondary to mood and trauma-related disorders, rather than a clean presentation of Adult Residual ADD. However, developmental ADHD cannot be fully ruled out and may coexist, but the current attentional impairments are more clearly accounted for by psychiatric factors, possible residual effects of TBI and sleep disturbance.  Summary and Clinical Impressions  Overall, this neuropsychological evaluation reflects: Valid performance across cognitive and personality measures Intellectual functioning within average expectations Specific attentional weaknesses involving: Sustained attention Attentional shifting Mild processing-speed reductions under stress Strong encoding and fundamental attention abilities, inconsistent with a primary ADHD pattern alone A psychiatric profile dominated by: Significant anxiety Mood dysregulation Posttraumatic stress Somatic and cognitive fatigue Sleep issues likely exacerbating daytime cognitive functioning During the feedback visit I will review issues of possible underlying PTSD as this was not fully investigated during the initial formal clinical interview.  Conclusion:  Cognitive findings are best explained by mood instability (bipolar spectrum), anxiety, trauma-related symptoms, and poor sleep, rather than isolated neurodevelopmental ADHD. Attentional vulnerabilities are present but appear secondary to psychiatric and behavioral factors.  Recommendations for Patient  1. Mental Health Care Keep working closely with your psychiatrist to monitor your mood, anxiety, and overall emotional health. Your symptoms of anxiety, trauma, and mood changes can affect your concentration, so making sure your medications are well-balanced is important. If your sleep or anxiety continues to be a problem, let your provider know--there may be options to help with these symptoms specifically.  2. Therapy Support Based on your history and test results,  certain types of therapy may be especially helpful: Trauma-focused therapy (like EMDR or Cognitive Processing Therapy) can help reduce the impact of past traumatic experiences. CBT or DBT skills can help you learn tools for managing stress, regulating emotions, and improving communication. Therapy that supports mood stability to help you build consistent routines that support better sleep and emotional balance.  3. Managing Attention and Concentration You have strengths in memory and accuracy, but you may struggle more when it comes to staying focused for long periods or switching tasks quickly. These strategies can help: Make your environment work for you Reduce distractions where possible (silence your phone, tidy your workspace, turn off notifications). Work in a quiet space when you can. Break tasks down Divide big tasks into smaller, easy-to-complete steps. Use short work periods (20-30 minutes) with brief breaks in between. Use reminders and visual tools Calendars, alarms, or apps can help you stay organized. Checklists help keep track of tasks so you're not relying on memory alone.  4. Improving Sleep Better sleep can make a big difference in mood, energy, and attention. Some helpful steps include: Try not to use your phone when you wake up during the night--blue light and stimulating  content make it harder to fall back asleep. Go to bed and wake up around the same time each day, even on weekends. Create a relaxing routine before bed: reading, stretching, or calming music. If sleep continues to be a struggle, a sleep medicine specialist may be able to help rule out other issues.  5. Wellness & Stress Management A consistent daily routine can help with emotional stability. Regular exercise (even walking) helps with anxiety, mood, and attention. Mindfulness or grounding exercises can help calm racing thoughts or tension. Try to reduce excessive stress where possible and build in  small moments of rest.  6. Substance Use You've done meaningful work in staying away from alcohol and marijuana. Continuing your abstinence will support your mood stability and attention. If needed, support groups or therapy can help you maintain these healthy choices.  7. Work & Daily Functioning To help with focus at work: Ask for instructions in writing whenever possible. Do your most focus-heavy tasks earlier in the day, when your energy is higher. Break work into smaller steps. It may be reasonable to request small accommodations if needed, such as short breaks or a quieter workspace.  Diagnosis:    Bipolar 1 disorder, depressed, severe (HCC)  Attention and concentration deficit  Will need to further investigate possible posttraumatic stress disorder type features as part of a clinical picture as well as the possibility of underlying late effects of TBI although this may be more difficult to differentiate as her reported injuries happened around age 71.   _____________________ Norleen Asa, Psy.D. Clinical Neuropsychologist

## 2025-01-20 ENCOUNTER — Encounter: Admitting: Psychology
